# Patient Record
Sex: Female | Born: 1935 | Race: White | Hispanic: No | Marital: Married | Smoking: Never smoker
Health system: Southern US, Community
[De-identification: ages and names within clinical notes are randomized; demographics above are authoritative.]

## PROBLEM LIST (undated history)

## (undated) DIAGNOSIS — E7800 Pure hypercholesterolemia, unspecified: Secondary | ICD-10-CM

## (undated) DIAGNOSIS — K219 Gastro-esophageal reflux disease without esophagitis: Secondary | ICD-10-CM

## (undated) HISTORY — PX: BREAST SURGERY: SHX581

## (undated) HISTORY — PX: CHOLECYSTECTOMY: SHX55

## (undated) HISTORY — PX: ABDOMINAL HYSTERECTOMY: SHX81

---

## 2005-02-25 ENCOUNTER — Ambulatory Visit (HOSPITAL_COMMUNITY): Admission: RE | Admit: 2005-02-25 | Discharge: 2005-02-25 | Payer: Self-pay | Admitting: Orthopaedic Surgery

## 2006-09-28 ENCOUNTER — Encounter (HOSPITAL_COMMUNITY): Admission: RE | Admit: 2006-09-28 | Discharge: 2006-10-28 | Payer: Self-pay | Admitting: Neurology

## 2007-01-03 ENCOUNTER — Ambulatory Visit (HOSPITAL_COMMUNITY): Admission: RE | Admit: 2007-01-03 | Discharge: 2007-01-03 | Payer: Self-pay | Admitting: Ophthalmology

## 2011-02-23 DIAGNOSIS — R079 Chest pain, unspecified: Secondary | ICD-10-CM

## 2013-06-08 ENCOUNTER — Encounter (HOSPITAL_COMMUNITY): Payer: Medicare Other

## 2013-06-23 ENCOUNTER — Emergency Department (HOSPITAL_COMMUNITY): Payer: Medicare HMO

## 2013-06-23 ENCOUNTER — Emergency Department (HOSPITAL_COMMUNITY)
Admission: EM | Admit: 2013-06-23 | Discharge: 2013-06-23 | Disposition: A | Discharge status: Home / Self Care | Payer: Medicare HMO | Attending: Emergency Medicine | Admitting: Emergency Medicine

## 2013-06-23 ENCOUNTER — Encounter (HOSPITAL_COMMUNITY): Payer: Self-pay | Admitting: Emergency Medicine

## 2013-06-23 DIAGNOSIS — R0602 Shortness of breath: Secondary | ICD-10-CM | POA: Insufficient documentation

## 2013-06-23 DIAGNOSIS — E7800 Pure hypercholesterolemia, unspecified: Secondary | ICD-10-CM | POA: Insufficient documentation

## 2013-06-23 DIAGNOSIS — R079 Chest pain, unspecified: Secondary | ICD-10-CM

## 2013-06-23 DIAGNOSIS — R0789 Other chest pain: Secondary | ICD-10-CM | POA: Insufficient documentation

## 2013-06-23 DIAGNOSIS — R05 Cough: Secondary | ICD-10-CM | POA: Insufficient documentation

## 2013-06-23 DIAGNOSIS — K219 Gastro-esophageal reflux disease without esophagitis: Secondary | ICD-10-CM | POA: Insufficient documentation

## 2013-06-23 DIAGNOSIS — R059 Cough, unspecified: Secondary | ICD-10-CM | POA: Insufficient documentation

## 2013-06-23 HISTORY — DX: Gastro-esophageal reflux disease without esophagitis: K21.9

## 2013-06-23 HISTORY — DX: Pure hypercholesterolemia, unspecified: E78.00

## 2013-06-23 LAB — COMPREHENSIVE METABOLIC PANEL
ALK PHOS: 99 U/L (ref 39–117)
ALT: 16 U/L (ref 0–35)
AST: 18 U/L (ref 0–37)
Albumin: 3.3 g/dL — ABNORMAL LOW (ref 3.5–5.2)
BUN: 13 mg/dL (ref 6–23)
CALCIUM: 9.1 mg/dL (ref 8.4–10.5)
CHLORIDE: 105 meq/L (ref 96–112)
CO2: 28 meq/L (ref 19–32)
Creatinine, Ser: 0.87 mg/dL (ref 0.50–1.10)
GFR, EST AFRICAN AMERICAN: 71 mL/min — AB (ref >90)
GFR, EST NON AFRICAN AMERICAN: 61 mL/min — AB (ref >90)
GLUCOSE: 108 mg/dL — AB (ref 70–99)
POTASSIUM: 3.8 meq/L (ref 3.7–5.3)
SODIUM: 143 meq/L (ref 137–147)
Total Bilirubin: 0.5 mg/dL (ref 0.3–1.2)
Total Protein: 6.8 g/dL (ref 6.0–8.3)

## 2013-06-23 LAB — CBC WITH DIFFERENTIAL/PLATELET
BASOS ABS: 0 10*3/uL (ref 0.0–0.1)
Basophils Relative: 0 % (ref 0–1)
Eosinophils Absolute: 0.2 10*3/uL (ref 0.0–0.7)
Eosinophils Relative: 2 % (ref 0–5)
HCT: 37.2 % (ref 36.0–46.0)
Hemoglobin: 12.5 g/dL (ref 12.0–15.0)
LYMPHS ABS: 1.5 10*3/uL (ref 0.7–4.0)
LYMPHS PCT: 21 % (ref 12–46)
MCH: 30.3 pg (ref 26.0–34.0)
MCHC: 33.6 g/dL (ref 30.0–36.0)
MCV: 90.3 fL (ref 78.0–100.0)
Monocytes Absolute: 0.6 10*3/uL (ref 0.1–1.0)
Monocytes Relative: 8 % (ref 3–12)
NEUTROS ABS: 4.9 10*3/uL (ref 1.7–7.7)
NEUTROS PCT: 69 % (ref 43–77)
PLATELETS: 253 10*3/uL (ref 150–400)
RBC: 4.12 MIL/uL (ref 3.87–5.11)
RDW: 13 % (ref 11.5–15.5)
WBC: 7.1 10*3/uL (ref 4.0–10.5)

## 2013-06-23 LAB — TROPONIN I: Troponin I: <0.3 ng/mL (ref <0.30)

## 2013-06-23 LAB — PRO B NATRIURETIC PEPTIDE: Pro B Natriuretic peptide (BNP): 87.3 pg/mL (ref 0–450)

## 2013-06-23 LAB — D-DIMER, QUANTITATIVE (NOT AT ARMC): D DIMER QUANT: 0.37 ug{FEU}/mL (ref 0.00–0.48)

## 2013-06-23 NOTE — ED Notes (Signed)
C/o chest pain, shortness of breath for the past week

## 2013-06-23 NOTE — Care Management Note (Signed)
Patient was noted to not have a PCP listed, but per patient PCP is Dr Hasani. Entered this information into computer. 

## 2013-06-23 NOTE — ED Provider Notes (Signed)
CSN: [id number]     Arrival date & time 06/23/13  1028 History  This chart was scribed for Paula LennertJoseph L Lydie Stammen, MD by Bronson CurbJacqueline Melvin, ED Scribe. This patient was seen in room APA11/APA11 and the patient's care was started at 11:58 AM.    Chief Complaint  Patient presents with  . Chest Pain     Patient is a 80 y.o. female presenting with shortness of breath. The history is provided by the patient. No language interpreter was used.  Shortness of Breath Severity:  Moderate Onset quality:  Gradual Duration:  1 week Timing:  Constant Progression:  Worsening Chronicity:  New Associated symptoms: cough   Associated symptoms: no abdominal pain, no chest pain, no fever, no headaches and no rash    HPI Comments: Angus SellerLaura E Hanson is a 80 y.o. female who presents to the Emergency Department complaining of gradual onset, gradually worsening SOB that started 7 days ago. She states associated chest tightness, and nonproductive cough for a few weeks. She states the SOB occurs even at rest. She denies fever, leg swelling, chest pain.   Past Medical History  Diagnosis Date  . Acid reflux   . Hypercholesteremia    Past Surgical History  Procedure Laterality Date  . Abdominal hysterectomy    . Breast surgery    . Cholecystectomy     No family history on file. History  Substance Use Topics  . Smoking status: Never Smoker   . Smokeless tobacco: Not on file  . Alcohol Use: No   OB History   Grav Para Term Preterm Abortions TAB SAB Ect Mult Living                 Review of Systems  Constitutional: Negative for fever, appetite change and fatigue.  HENT: Negative for congestion, ear discharge and sinus pressure.   Eyes: Negative for discharge.  Respiratory: Positive for cough, chest tightness and shortness of breath.   Cardiovascular: Negative for chest pain and leg swelling.  Gastrointestinal: Negative for abdominal pain and diarrhea.  Genitourinary: Negative for frequency and hematuria.   Musculoskeletal: Negative for back pain.  Skin: Negative for rash.  Neurological: Negative for seizures and headaches.  Psychiatric/Behavioral: Negative for hallucinations.      Allergies  Review of patient's allergies indicates no known allergies.  Home Medications  No current outpatient prescriptions on file. BP 178/75  Pulse 70  Temp(Src) 97.9 F (36.6 C)  Resp 20  Ht 5\' 4"  (1.626 m)  Wt 170 lb (77.111 kg)  BMI 29.17 kg/m2  SpO2 100% Physical Exam  Nursing note and vitals reviewed. Constitutional: She is oriented to person, place, and time. She appears well-developed.  HENT:  Head: Normocephalic.  Eyes: Conjunctivae and EOM are normal. No scleral icterus.  Neck: Neck supple. No thyromegaly present.  Cardiovascular: Normal rate, regular rhythm and normal heart sounds.  Exam reveals no gallop and no friction rub.   No murmur heard. Pulmonary/Chest: Effort normal and breath sounds normal. No stridor. She has no wheezes. She has no rales. She exhibits no tenderness.  Abdominal: Soft. She exhibits no distension. There is no tenderness. There is no rebound.  Musculoskeletal: Normal range of motion. She exhibits no edema.  Lymphadenopathy:    She has no cervical adenopathy.  Neurological: She is oriented to person, place, and time. She exhibits normal muscle tone. Coordination normal.  Skin: No rash noted. No erythema.  Psychiatric: She has a normal mood and affect. Her behavior is normal.  ED Course  Procedures (including critical care time)   DIAGNOSTIC STUDIES: Oxygen Saturation is 100% on RA, normal by my interpretation.    COORDINATION OF CARE: 12:03 PM Discussed treatment plan with pt at bedside and pt agreed to plan.     Labs Review Labs Reviewed  COMPREHENSIVE METABOLIC PANEL - Abnormal; Notable for the following:    Glucose, Bld 108 (*)    Albumin 3.3 (*)    GFR calc non Af Amer 61 (*)    GFR calc Af Amer 71 (*)    All other components within normal  limits  TROPONIN I  CBC WITH DIFFERENTIAL  PRO B NATRIURETIC PEPTIDE   Imaging Review Dg Chest 2 View  06/23/2013   CLINICAL DATA:  Chest pain and discomfort.  Difficulty breathing.  EXAM: CHEST  2 VIEW  COMPARISON:  None.  FINDINGS: Artifact overlies the chest. Heart size is normal. Mediastinal shadows are normal. The lungs are clear. No effusions. No bony abnormalities.  IMPRESSION: No active cardiopulmonary disease.   Electronically Signed   By: Paulina Fusi M.D.   On: 06/23/2013 11:51     EKG Interpretation   Date/Time:  Friday June 23 2013 10:52:57 EDT Ventricular Rate:  69 PR Interval:  176 QRS Duration: 86 QT Interval:  406 QTC Calculation: 435 R Axis:   -2 Text Interpretation:  Normal sinus rhythm Normal ECG No previous ECGs  available Confirmed by Piers Baade  MD, Verlyn Dannenberg [phone]) on 06/23/2013 4:00:06 PM      MDM   Final diagnoses:  None   Chest pain not exertional.  Normal studies.  Pt to increase prilosec and follow up with pcp The chart was scribed for me under my direct supervision.  I personally performed the history, physical, and medical decision making and all procedures in the evaluation of this patient.. .     Paula Lennert, MD 06/23/13 [phone]

## 2013-06-23 NOTE — Discharge Instructions (Signed)
Increase prilosec to twice a day and follow up with your md next week °

## 2015-03-21 DIAGNOSIS — M174 Other bilateral secondary osteoarthritis of knee: Secondary | ICD-10-CM | POA: Diagnosis not present

## 2015-03-21 DIAGNOSIS — Z6828 Body mass index (BMI) 28.0-28.9, adult: Secondary | ICD-10-CM | POA: Diagnosis not present

## 2015-03-21 DIAGNOSIS — E784 Other hyperlipidemia: Secondary | ICD-10-CM | POA: Diagnosis not present

## 2015-03-21 DIAGNOSIS — F418 Other specified anxiety disorders: Secondary | ICD-10-CM | POA: Diagnosis not present

## 2015-03-21 DIAGNOSIS — I10 Essential (primary) hypertension: Secondary | ICD-10-CM | POA: Diagnosis not present

## 2015-06-20 DIAGNOSIS — Z1389 Encounter for screening for other disorder: Secondary | ICD-10-CM | POA: Diagnosis not present

## 2015-06-20 DIAGNOSIS — M174 Other bilateral secondary osteoarthritis of knee: Secondary | ICD-10-CM | POA: Diagnosis not present

## 2015-06-20 DIAGNOSIS — Z0000 Encounter for general adult medical examination without abnormal findings: Secondary | ICD-10-CM | POA: Diagnosis not present

## 2015-06-20 DIAGNOSIS — Z6828 Body mass index (BMI) 28.0-28.9, adult: Secondary | ICD-10-CM | POA: Diagnosis not present

## 2015-06-20 DIAGNOSIS — I10 Essential (primary) hypertension: Secondary | ICD-10-CM | POA: Diagnosis not present

## 2015-09-09 DIAGNOSIS — Z90710 Acquired absence of both cervix and uterus: Secondary | ICD-10-CM | POA: Diagnosis not present

## 2015-09-09 DIAGNOSIS — Z79899 Other long term (current) drug therapy: Secondary | ICD-10-CM | POA: Diagnosis not present

## 2015-09-09 DIAGNOSIS — F419 Anxiety disorder, unspecified: Secondary | ICD-10-CM | POA: Diagnosis not present

## 2015-09-09 DIAGNOSIS — I10 Essential (primary) hypertension: Secondary | ICD-10-CM | POA: Diagnosis not present

## 2015-09-09 DIAGNOSIS — M810 Age-related osteoporosis without current pathological fracture: Secondary | ICD-10-CM | POA: Diagnosis not present

## 2015-09-09 DIAGNOSIS — Z780 Asymptomatic menopausal state: Secondary | ICD-10-CM | POA: Diagnosis not present

## 2015-09-09 DIAGNOSIS — M8588 Other specified disorders of bone density and structure, other site: Secondary | ICD-10-CM | POA: Diagnosis not present

## 2015-09-20 DIAGNOSIS — I10 Essential (primary) hypertension: Secondary | ICD-10-CM | POA: Diagnosis not present

## 2015-09-20 DIAGNOSIS — M174 Other bilateral secondary osteoarthritis of knee: Secondary | ICD-10-CM | POA: Diagnosis not present

## 2015-09-20 DIAGNOSIS — M810 Age-related osteoporosis without current pathological fracture: Secondary | ICD-10-CM | POA: Diagnosis not present

## 2015-09-20 DIAGNOSIS — Z6826 Body mass index (BMI) 26.0-26.9, adult: Secondary | ICD-10-CM | POA: Diagnosis not present

## 2015-09-20 DIAGNOSIS — K210 Gastro-esophageal reflux disease with esophagitis: Secondary | ICD-10-CM | POA: Diagnosis not present

## 2015-10-07 DIAGNOSIS — M810 Age-related osteoporosis without current pathological fracture: Secondary | ICD-10-CM | POA: Diagnosis not present

## 2015-11-21 DIAGNOSIS — I10 Essential (primary) hypertension: Secondary | ICD-10-CM | POA: Diagnosis not present

## 2015-11-21 DIAGNOSIS — M158 Other polyosteoarthritis: Secondary | ICD-10-CM | POA: Diagnosis not present

## 2015-11-21 DIAGNOSIS — K210 Gastro-esophageal reflux disease with esophagitis: Secondary | ICD-10-CM | POA: Diagnosis not present

## 2015-12-18 DIAGNOSIS — I10 Essential (primary) hypertension: Secondary | ICD-10-CM | POA: Diagnosis not present

## 2015-12-18 DIAGNOSIS — M158 Other polyosteoarthritis: Secondary | ICD-10-CM | POA: Diagnosis not present

## 2015-12-18 DIAGNOSIS — K210 Gastro-esophageal reflux disease with esophagitis: Secondary | ICD-10-CM | POA: Diagnosis not present

## 2015-12-23 DIAGNOSIS — Z79899 Other long term (current) drug therapy: Secondary | ICD-10-CM | POA: Diagnosis not present

## 2015-12-23 DIAGNOSIS — Z6827 Body mass index (BMI) 27.0-27.9, adult: Secondary | ICD-10-CM | POA: Diagnosis not present

## 2015-12-23 DIAGNOSIS — K210 Gastro-esophageal reflux disease with esophagitis: Secondary | ICD-10-CM | POA: Diagnosis not present

## 2015-12-23 DIAGNOSIS — M810 Age-related osteoporosis without current pathological fracture: Secondary | ICD-10-CM | POA: Diagnosis not present

## 2015-12-23 DIAGNOSIS — I10 Essential (primary) hypertension: Secondary | ICD-10-CM | POA: Diagnosis not present

## 2016-01-08 ENCOUNTER — Ambulatory Visit (INDEPENDENT_AMBULATORY_CARE_PROVIDER_SITE_OTHER): Payer: PPO

## 2016-01-08 ENCOUNTER — Ambulatory Visit (INDEPENDENT_AMBULATORY_CARE_PROVIDER_SITE_OTHER): Payer: PPO | Admitting: Orthopaedic Surgery

## 2016-01-08 VITALS — BP 167/79 | HR 59 | Temp 97.5°F | Ht 64.0 in | Wt 168.4 lb

## 2016-01-08 DIAGNOSIS — M5442 Lumbago with sciatica, left side: Secondary | ICD-10-CM

## 2016-01-08 DIAGNOSIS — M25552 Pain in left hip: Secondary | ICD-10-CM | POA: Diagnosis not present

## 2016-01-08 DIAGNOSIS — M7062 Trochanteric bursitis, left hip: Secondary | ICD-10-CM

## 2016-01-08 MED ORDER — NAPROXEN 500 MG PO TABS: 500.0000 mg | ORAL_TABLET | Freq: Two times a day (BID) | ORAL | 5 refills | Status: DC

## 2016-01-08 NOTE — Progress Notes (Signed)
Subjective: My left hip hurts and I have pain that goes down my left leg    Patient ID: Paula Hanson, female    DOB: 1930/12/10, 82 y.o.   MRN: [id number]  HPI She has had pain that runs from the left hip to the left lateral foot for about a month to six weeks.  It is more painful at night. She has no trauma.  She has more pain of the left lateral hip area. She says it does not hurt during the day. She has no redness, no other joint pains.  She has tried Aleve which helped some.  She has used ice and heat as well.  She is active.  She has no weakness or bowel or bladder problems.   Review of Systems  HENT: Negative for congestion.   Respiratory: Negative for cough and shortness of breath.   Cardiovascular: Negative for chest pain and leg swelling.  Endocrine: Positive for cold intolerance.  Musculoskeletal: Positive for arthralgias, back pain and gait problem.  Allergic/Immunologic: Positive for environmental allergies.   Past Medical History:  Diagnosis Date  . Acid reflux   . Hypercholesteremia     Past Surgical History:  Procedure Laterality Date  . ABDOMINAL HYSTERECTOMY    . BREAST SURGERY    . CHOLECYSTECTOMY      Current Outpatient Prescriptions on File Prior to Visit  Medication Sig Dispense Refill  . Omega-3 Fatty Acids (FISH OIL PO) Take 3 capsules by mouth daily.    Marland Kitchen atorvastatin (LIPITOR) 40 MG tablet Take 40 mg by mouth daily.    Marland Kitchen omeprazole (PRILOSEC) 40 MG capsule Take 1 capsule by mouth daily.     No current facility-administered medications on file prior to visit.     Social History   Social History  . Marital status: Married    Spouse name: N/A  . Number of children: N/A  . Years of education: N/A   Occupational History  . Not on file.   Social History Main Topics  . Smoking status: Never Smoker  . Smokeless tobacco: Not on file  . Alcohol use No  . Drug use: Unknown  . Sexual activity: Not on file   Other Topics Concern  . Not on file    Social History Narrative  . No narrative on file    High blood pressure, heart disease runs in the family as well as osteoporosis.  Her mother died of stroke and her father of heart attack.  BP (!) 167/79   Pulse (!) 59   Temp 97.5 F (36.4 C)   Ht 5\' 4"  (1.626 m)   Wt 168 lb 6.4 oz (76.4 kg)   BMI 28.91 kg/m      Objective:   Physical Exam  Constitutional: She is oriented to person, place, and time. She appears well-developed and well-nourished.  HENT:  Head: Normocephalic and atraumatic.  Eyes: Conjunctivae and EOM are normal. Pupils are equal, round, and reactive to light.  Neck: Normal range of motion. Neck supple.  Cardiovascular: Normal rate, regular rhythm and intact distal pulses.   Pulmonary/Chest: Effort normal.  Abdominal: Soft.  Musculoskeletal: She exhibits tenderness (Pain of the left hip lateral trochanteric area, no redness, full ROM of hips.  Back with slight lower left pain, no spasm, full ROM.  NV intact.  SLR normal.).  Neurological: She is alert and oriented to person, place, and time. She displays normal reflexes. No cranial nerve deficit. She exhibits normal muscle tone. Coordination normal.  Skin: Skin is warm and dry.  Psychiatric: She has a normal mood and affect. Her behavior is normal. Judgment and thought content normal.   X-rays were done of the left hip and lumbar spine, reported separately.       Assessment & Plan:   Encounter Diagnoses  Name Primary?  . Left hip pain Yes  . Acute left-sided low back pain with left-sided sciatica   . Trochanteric bursitis, left hip    PROCEDURE NOTE:  The patient request injection, verbal consent was obtained.  The left trochanteric area of the hip was prepped appropriately after time out was performed.   Sterile technique was observed and injection of 1 cc of Depo-Medrol 40 mg with several cc's of plain xylocaine. Anesthesia was provided by ethyl chloride and a 20-gauge needle was used to inject  the hip area. The injection was tolerated well.  A band aid dressing was applied.  The patient was advised to apply ice later today and tomorrow to the injection sight as needed.  I will begin Naprosyn.  Precautions discussed.  Return in two weeks.  Call if any problem.  She may need MRI of the lumbar spine.  Electronically Signed Darreld McleanWayne Ardyce Heyer, MD 10/25/201710:18 AM

## 2016-01-15 DIAGNOSIS — I10 Essential (primary) hypertension: Secondary | ICD-10-CM | POA: Diagnosis not present

## 2016-01-15 DIAGNOSIS — M158 Other polyosteoarthritis: Secondary | ICD-10-CM | POA: Diagnosis not present

## 2016-01-15 DIAGNOSIS — K210 Gastro-esophageal reflux disease with esophagitis: Secondary | ICD-10-CM | POA: Diagnosis not present

## 2016-01-22 ENCOUNTER — Ambulatory Visit: Payer: PPO | Admitting: Orthopaedic Surgery

## 2016-02-26 DIAGNOSIS — M158 Other polyosteoarthritis: Secondary | ICD-10-CM | POA: Diagnosis not present

## 2016-02-26 DIAGNOSIS — K210 Gastro-esophageal reflux disease with esophagitis: Secondary | ICD-10-CM | POA: Diagnosis not present

## 2016-02-26 DIAGNOSIS — I10 Essential (primary) hypertension: Secondary | ICD-10-CM | POA: Diagnosis not present

## 2016-03-27 DIAGNOSIS — Z1231 Encounter for screening mammogram for malignant neoplasm of breast: Secondary | ICD-10-CM | POA: Diagnosis not present

## 2016-03-30 DIAGNOSIS — K210 Gastro-esophageal reflux disease with esophagitis: Secondary | ICD-10-CM | POA: Diagnosis not present

## 2016-03-30 DIAGNOSIS — Z6827 Body mass index (BMI) 27.0-27.9, adult: Secondary | ICD-10-CM | POA: Diagnosis not present

## 2016-03-30 DIAGNOSIS — I10 Essential (primary) hypertension: Secondary | ICD-10-CM | POA: Diagnosis not present

## 2016-03-30 DIAGNOSIS — M810 Age-related osteoporosis without current pathological fracture: Secondary | ICD-10-CM | POA: Diagnosis not present

## 2016-03-30 DIAGNOSIS — F411 Generalized anxiety disorder: Secondary | ICD-10-CM | POA: Diagnosis not present

## 2016-04-21 DIAGNOSIS — F411 Generalized anxiety disorder: Secondary | ICD-10-CM | POA: Diagnosis not present

## 2016-04-21 DIAGNOSIS — M810 Age-related osteoporosis without current pathological fracture: Secondary | ICD-10-CM | POA: Diagnosis not present

## 2016-04-21 DIAGNOSIS — K210 Gastro-esophageal reflux disease with esophagitis: Secondary | ICD-10-CM | POA: Diagnosis not present

## 2016-04-21 DIAGNOSIS — I10 Essential (primary) hypertension: Secondary | ICD-10-CM | POA: Diagnosis not present

## 2016-05-19 DIAGNOSIS — I10 Essential (primary) hypertension: Secondary | ICD-10-CM | POA: Diagnosis not present

## 2016-05-19 DIAGNOSIS — F411 Generalized anxiety disorder: Secondary | ICD-10-CM | POA: Diagnosis not present

## 2016-05-19 DIAGNOSIS — M810 Age-related osteoporosis without current pathological fracture: Secondary | ICD-10-CM | POA: Diagnosis not present

## 2016-05-19 DIAGNOSIS — K210 Gastro-esophageal reflux disease with esophagitis: Secondary | ICD-10-CM | POA: Diagnosis not present

## 2016-06-30 DIAGNOSIS — F411 Generalized anxiety disorder: Secondary | ICD-10-CM | POA: Diagnosis not present

## 2016-06-30 DIAGNOSIS — I10 Essential (primary) hypertension: Secondary | ICD-10-CM | POA: Diagnosis not present

## 2016-06-30 DIAGNOSIS — Z0000 Encounter for general adult medical examination without abnormal findings: Secondary | ICD-10-CM | POA: Diagnosis not present

## 2016-06-30 DIAGNOSIS — Z1389 Encounter for screening for other disorder: Secondary | ICD-10-CM | POA: Diagnosis not present

## 2016-06-30 DIAGNOSIS — Z6827 Body mass index (BMI) 27.0-27.9, adult: Secondary | ICD-10-CM | POA: Diagnosis not present

## 2016-06-30 DIAGNOSIS — M810 Age-related osteoporosis without current pathological fracture: Secondary | ICD-10-CM | POA: Diagnosis not present

## 2016-06-30 DIAGNOSIS — K210 Gastro-esophageal reflux disease with esophagitis: Secondary | ICD-10-CM | POA: Diagnosis not present

## 2016-07-09 DIAGNOSIS — I10 Essential (primary) hypertension: Secondary | ICD-10-CM | POA: Diagnosis not present

## 2016-07-09 DIAGNOSIS — M158 Other polyosteoarthritis: Secondary | ICD-10-CM | POA: Diagnosis not present

## 2016-07-09 DIAGNOSIS — K210 Gastro-esophageal reflux disease with esophagitis: Secondary | ICD-10-CM | POA: Diagnosis not present

## 2016-07-30 DIAGNOSIS — K210 Gastro-esophageal reflux disease with esophagitis: Secondary | ICD-10-CM | POA: Diagnosis not present

## 2016-07-30 DIAGNOSIS — I10 Essential (primary) hypertension: Secondary | ICD-10-CM | POA: Diagnosis not present

## 2016-07-30 DIAGNOSIS — M158 Other polyosteoarthritis: Secondary | ICD-10-CM | POA: Diagnosis not present

## 2016-08-07 DIAGNOSIS — R5383 Other fatigue: Secondary | ICD-10-CM | POA: Diagnosis not present

## 2016-08-07 DIAGNOSIS — Z6827 Body mass index (BMI) 27.0-27.9, adult: Secondary | ICD-10-CM | POA: Diagnosis not present

## 2016-08-11 DIAGNOSIS — R5383 Other fatigue: Secondary | ICD-10-CM | POA: Diagnosis not present

## 2016-08-18 DIAGNOSIS — I10 Essential (primary) hypertension: Secondary | ICD-10-CM | POA: Diagnosis not present

## 2016-08-18 DIAGNOSIS — M545 Low back pain: Secondary | ICD-10-CM | POA: Diagnosis not present

## 2016-08-18 DIAGNOSIS — K210 Gastro-esophageal reflux disease with esophagitis: Secondary | ICD-10-CM | POA: Diagnosis not present

## 2016-10-06 DIAGNOSIS — Z6827 Body mass index (BMI) 27.0-27.9, adult: Secondary | ICD-10-CM | POA: Diagnosis not present

## 2016-10-06 DIAGNOSIS — M810 Age-related osteoporosis without current pathological fracture: Secondary | ICD-10-CM | POA: Diagnosis not present

## 2016-10-06 DIAGNOSIS — I10 Essential (primary) hypertension: Secondary | ICD-10-CM | POA: Diagnosis not present

## 2016-10-06 DIAGNOSIS — K210 Gastro-esophageal reflux disease with esophagitis: Secondary | ICD-10-CM | POA: Diagnosis not present

## 2016-10-20 DIAGNOSIS — K2960 Other gastritis without bleeding: Secondary | ICD-10-CM | POA: Diagnosis not present

## 2016-10-20 DIAGNOSIS — Z87891 Personal history of nicotine dependence: Secondary | ICD-10-CM | POA: Diagnosis not present

## 2016-10-20 DIAGNOSIS — Z780 Asymptomatic menopausal state: Secondary | ICD-10-CM | POA: Diagnosis not present

## 2016-10-20 DIAGNOSIS — R0602 Shortness of breath: Secondary | ICD-10-CM | POA: Diagnosis not present

## 2016-10-20 DIAGNOSIS — R0789 Other chest pain: Secondary | ICD-10-CM | POA: Diagnosis not present

## 2016-10-20 DIAGNOSIS — R42 Dizziness and giddiness: Secondary | ICD-10-CM | POA: Diagnosis not present

## 2016-10-20 DIAGNOSIS — M810 Age-related osteoporosis without current pathological fracture: Secondary | ICD-10-CM | POA: Diagnosis not present

## 2016-10-20 DIAGNOSIS — B9681 Helicobacter pylori [H. pylori] as the cause of diseases classified elsewhere: Secondary | ICD-10-CM | POA: Diagnosis not present

## 2016-10-20 DIAGNOSIS — Z8249 Family history of ischemic heart disease and other diseases of the circulatory system: Secondary | ICD-10-CM | POA: Diagnosis not present

## 2016-10-20 DIAGNOSIS — I10 Essential (primary) hypertension: Secondary | ICD-10-CM | POA: Diagnosis not present

## 2016-10-20 DIAGNOSIS — K2970 Gastritis, unspecified, without bleeding: Secondary | ICD-10-CM | POA: Diagnosis not present

## 2016-10-20 DIAGNOSIS — M1990 Unspecified osteoarthritis, unspecified site: Secondary | ICD-10-CM | POA: Diagnosis not present

## 2016-10-20 DIAGNOSIS — I700 Atherosclerosis of aorta: Secondary | ICD-10-CM | POA: Diagnosis not present

## 2016-10-20 DIAGNOSIS — Z79899 Other long term (current) drug therapy: Secondary | ICD-10-CM | POA: Diagnosis not present

## 2016-10-20 DIAGNOSIS — K219 Gastro-esophageal reflux disease without esophagitis: Secondary | ICD-10-CM | POA: Diagnosis not present

## 2016-10-20 DIAGNOSIS — R079 Chest pain, unspecified: Secondary | ICD-10-CM | POA: Diagnosis not present

## 2016-10-20 DIAGNOSIS — F411 Generalized anxiety disorder: Secondary | ICD-10-CM | POA: Diagnosis not present

## 2016-10-20 DIAGNOSIS — R531 Weakness: Secondary | ICD-10-CM | POA: Diagnosis not present

## 2016-10-21 DIAGNOSIS — K219 Gastro-esophageal reflux disease without esophagitis: Secondary | ICD-10-CM | POA: Diagnosis not present

## 2016-10-21 DIAGNOSIS — R0789 Other chest pain: Secondary | ICD-10-CM | POA: Diagnosis not present

## 2016-10-21 DIAGNOSIS — I10 Essential (primary) hypertension: Secondary | ICD-10-CM | POA: Diagnosis not present

## 2016-10-21 DIAGNOSIS — K2960 Other gastritis without bleeding: Secondary | ICD-10-CM | POA: Diagnosis not present

## 2016-10-21 DIAGNOSIS — B9681 Helicobacter pylori [H. pylori] as the cause of diseases classified elsewhere: Secondary | ICD-10-CM | POA: Diagnosis not present

## 2016-10-27 DIAGNOSIS — I10 Essential (primary) hypertension: Secondary | ICD-10-CM | POA: Diagnosis not present

## 2016-10-27 DIAGNOSIS — K210 Gastro-esophageal reflux disease with esophagitis: Secondary | ICD-10-CM | POA: Diagnosis not present

## 2016-10-27 DIAGNOSIS — M158 Other polyosteoarthritis: Secondary | ICD-10-CM | POA: Diagnosis not present

## 2016-10-30 DIAGNOSIS — K2900 Acute gastritis without bleeding: Secondary | ICD-10-CM | POA: Diagnosis not present

## 2016-10-30 DIAGNOSIS — Z6826 Body mass index (BMI) 26.0-26.9, adult: Secondary | ICD-10-CM | POA: Diagnosis not present

## 2016-11-04 DIAGNOSIS — R197 Diarrhea, unspecified: Secondary | ICD-10-CM | POA: Diagnosis not present

## 2016-11-20 DIAGNOSIS — K210 Gastro-esophageal reflux disease with esophagitis: Secondary | ICD-10-CM | POA: Diagnosis not present

## 2016-11-20 DIAGNOSIS — M150 Primary generalized (osteo)arthritis: Secondary | ICD-10-CM | POA: Diagnosis not present

## 2016-11-20 DIAGNOSIS — I10 Essential (primary) hypertension: Secondary | ICD-10-CM | POA: Diagnosis not present

## 2017-01-13 DIAGNOSIS — I10 Essential (primary) hypertension: Secondary | ICD-10-CM | POA: Diagnosis not present

## 2017-01-13 DIAGNOSIS — F411 Generalized anxiety disorder: Secondary | ICD-10-CM | POA: Diagnosis not present

## 2017-01-13 DIAGNOSIS — M810 Age-related osteoporosis without current pathological fracture: Secondary | ICD-10-CM | POA: Diagnosis not present

## 2017-01-13 DIAGNOSIS — Z6827 Body mass index (BMI) 27.0-27.9, adult: Secondary | ICD-10-CM | POA: Diagnosis not present

## 2017-01-13 DIAGNOSIS — K210 Gastro-esophageal reflux disease with esophagitis: Secondary | ICD-10-CM | POA: Diagnosis not present

## 2017-02-23 DIAGNOSIS — M810 Age-related osteoporosis without current pathological fracture: Secondary | ICD-10-CM | POA: Diagnosis not present

## 2017-02-23 DIAGNOSIS — K210 Gastro-esophageal reflux disease with esophagitis: Secondary | ICD-10-CM | POA: Diagnosis not present

## 2017-02-23 DIAGNOSIS — I10 Essential (primary) hypertension: Secondary | ICD-10-CM | POA: Diagnosis not present

## 2017-03-31 DIAGNOSIS — M810 Age-related osteoporosis without current pathological fracture: Secondary | ICD-10-CM | POA: Diagnosis not present

## 2017-03-31 DIAGNOSIS — I10 Essential (primary) hypertension: Secondary | ICD-10-CM | POA: Diagnosis not present

## 2017-03-31 DIAGNOSIS — K210 Gastro-esophageal reflux disease with esophagitis: Secondary | ICD-10-CM | POA: Diagnosis not present

## 2017-04-08 DIAGNOSIS — Z1231 Encounter for screening mammogram for malignant neoplasm of breast: Secondary | ICD-10-CM | POA: Diagnosis not present

## 2017-04-26 DIAGNOSIS — Z6827 Body mass index (BMI) 27.0-27.9, adult: Secondary | ICD-10-CM | POA: Diagnosis not present

## 2017-04-26 DIAGNOSIS — I10 Essential (primary) hypertension: Secondary | ICD-10-CM | POA: Diagnosis not present

## 2017-04-26 DIAGNOSIS — F411 Generalized anxiety disorder: Secondary | ICD-10-CM | POA: Diagnosis not present

## 2017-04-26 DIAGNOSIS — K210 Gastro-esophageal reflux disease with esophagitis: Secondary | ICD-10-CM | POA: Diagnosis not present

## 2017-04-26 DIAGNOSIS — M810 Age-related osteoporosis without current pathological fracture: Secondary | ICD-10-CM | POA: Diagnosis not present

## 2017-04-26 DIAGNOSIS — F5105 Insomnia due to other mental disorder: Secondary | ICD-10-CM | POA: Diagnosis not present

## 2017-07-05 DIAGNOSIS — K210 Gastro-esophageal reflux disease with esophagitis: Secondary | ICD-10-CM | POA: Diagnosis not present

## 2017-07-05 DIAGNOSIS — M810 Age-related osteoporosis without current pathological fracture: Secondary | ICD-10-CM | POA: Diagnosis not present

## 2017-07-05 DIAGNOSIS — F411 Generalized anxiety disorder: Secondary | ICD-10-CM | POA: Diagnosis not present

## 2017-07-05 DIAGNOSIS — I10 Essential (primary) hypertension: Secondary | ICD-10-CM | POA: Diagnosis not present

## 2017-07-26 DIAGNOSIS — I10 Essential (primary) hypertension: Secondary | ICD-10-CM | POA: Diagnosis not present

## 2017-07-26 DIAGNOSIS — F411 Generalized anxiety disorder: Secondary | ICD-10-CM | POA: Diagnosis not present

## 2017-07-26 DIAGNOSIS — K210 Gastro-esophageal reflux disease with esophagitis: Secondary | ICD-10-CM | POA: Diagnosis not present

## 2017-07-26 DIAGNOSIS — M810 Age-related osteoporosis without current pathological fracture: Secondary | ICD-10-CM | POA: Diagnosis not present

## 2017-07-29 DIAGNOSIS — Z6828 Body mass index (BMI) 28.0-28.9, adult: Secondary | ICD-10-CM | POA: Diagnosis not present

## 2017-07-29 DIAGNOSIS — K210 Gastro-esophageal reflux disease with esophagitis: Secondary | ICD-10-CM | POA: Diagnosis not present

## 2017-07-29 DIAGNOSIS — F411 Generalized anxiety disorder: Secondary | ICD-10-CM | POA: Diagnosis not present

## 2017-07-29 DIAGNOSIS — F5105 Insomnia due to other mental disorder: Secondary | ICD-10-CM | POA: Diagnosis not present

## 2017-07-29 DIAGNOSIS — M810 Age-related osteoporosis without current pathological fracture: Secondary | ICD-10-CM | POA: Diagnosis not present

## 2017-07-29 DIAGNOSIS — I10 Essential (primary) hypertension: Secondary | ICD-10-CM | POA: Diagnosis not present

## 2017-10-01 DIAGNOSIS — M810 Age-related osteoporosis without current pathological fracture: Secondary | ICD-10-CM | POA: Diagnosis not present

## 2017-10-01 DIAGNOSIS — I10 Essential (primary) hypertension: Secondary | ICD-10-CM | POA: Diagnosis not present

## 2017-10-01 DIAGNOSIS — F411 Generalized anxiety disorder: Secondary | ICD-10-CM | POA: Diagnosis not present

## 2017-10-01 DIAGNOSIS — K210 Gastro-esophageal reflux disease with esophagitis: Secondary | ICD-10-CM | POA: Diagnosis not present

## 2017-11-09 DIAGNOSIS — K210 Gastro-esophageal reflux disease with esophagitis: Secondary | ICD-10-CM | POA: Diagnosis not present

## 2017-11-09 DIAGNOSIS — Z0000 Encounter for general adult medical examination without abnormal findings: Secondary | ICD-10-CM | POA: Diagnosis not present

## 2017-11-09 DIAGNOSIS — F411 Generalized anxiety disorder: Secondary | ICD-10-CM | POA: Diagnosis not present

## 2017-11-09 DIAGNOSIS — M810 Age-related osteoporosis without current pathological fracture: Secondary | ICD-10-CM | POA: Diagnosis not present

## 2017-11-09 DIAGNOSIS — F5105 Insomnia due to other mental disorder: Secondary | ICD-10-CM | POA: Diagnosis not present

## 2017-11-09 DIAGNOSIS — Z6828 Body mass index (BMI) 28.0-28.9, adult: Secondary | ICD-10-CM | POA: Diagnosis not present

## 2017-11-09 DIAGNOSIS — I10 Essential (primary) hypertension: Secondary | ICD-10-CM | POA: Diagnosis not present

## 2017-11-09 DIAGNOSIS — Z1389 Encounter for screening for other disorder: Secondary | ICD-10-CM | POA: Diagnosis not present

## 2017-11-11 DIAGNOSIS — F411 Generalized anxiety disorder: Secondary | ICD-10-CM | POA: Diagnosis not present

## 2017-11-11 DIAGNOSIS — K210 Gastro-esophageal reflux disease with esophagitis: Secondary | ICD-10-CM | POA: Diagnosis not present

## 2017-11-11 DIAGNOSIS — I10 Essential (primary) hypertension: Secondary | ICD-10-CM | POA: Diagnosis not present

## 2017-11-11 DIAGNOSIS — M810 Age-related osteoporosis without current pathological fracture: Secondary | ICD-10-CM | POA: Diagnosis not present

## 2017-12-10 DIAGNOSIS — F411 Generalized anxiety disorder: Secondary | ICD-10-CM | POA: Diagnosis not present

## 2017-12-10 DIAGNOSIS — K210 Gastro-esophageal reflux disease with esophagitis: Secondary | ICD-10-CM | POA: Diagnosis not present

## 2017-12-10 DIAGNOSIS — M810 Age-related osteoporosis without current pathological fracture: Secondary | ICD-10-CM | POA: Diagnosis not present

## 2017-12-10 DIAGNOSIS — I10 Essential (primary) hypertension: Secondary | ICD-10-CM | POA: Diagnosis not present

## 2017-12-14 DIAGNOSIS — M810 Age-related osteoporosis without current pathological fracture: Secondary | ICD-10-CM | POA: Diagnosis not present

## 2018-01-19 DIAGNOSIS — K210 Gastro-esophageal reflux disease with esophagitis: Secondary | ICD-10-CM | POA: Diagnosis not present

## 2018-01-19 DIAGNOSIS — F411 Generalized anxiety disorder: Secondary | ICD-10-CM | POA: Diagnosis not present

## 2018-01-19 DIAGNOSIS — I10 Essential (primary) hypertension: Secondary | ICD-10-CM | POA: Diagnosis not present

## 2018-01-19 DIAGNOSIS — M810 Age-related osteoporosis without current pathological fracture: Secondary | ICD-10-CM | POA: Diagnosis not present

## 2018-02-21 DIAGNOSIS — Z0000 Encounter for general adult medical examination without abnormal findings: Secondary | ICD-10-CM | POA: Diagnosis not present

## 2018-02-21 DIAGNOSIS — Z6828 Body mass index (BMI) 28.0-28.9, adult: Secondary | ICD-10-CM | POA: Diagnosis not present

## 2018-02-22 DIAGNOSIS — M158 Other polyosteoarthritis: Secondary | ICD-10-CM | POA: Diagnosis not present

## 2018-02-22 DIAGNOSIS — I10 Essential (primary) hypertension: Secondary | ICD-10-CM | POA: Diagnosis not present

## 2018-02-22 DIAGNOSIS — K210 Gastro-esophageal reflux disease with esophagitis: Secondary | ICD-10-CM | POA: Diagnosis not present

## 2018-04-06 DIAGNOSIS — I10 Essential (primary) hypertension: Secondary | ICD-10-CM | POA: Diagnosis not present

## 2018-04-06 DIAGNOSIS — K210 Gastro-esophageal reflux disease with esophagitis: Secondary | ICD-10-CM | POA: Diagnosis not present

## 2018-04-06 DIAGNOSIS — M158 Other polyosteoarthritis: Secondary | ICD-10-CM | POA: Diagnosis not present

## 2018-04-11 DIAGNOSIS — L821 Other seborrheic keratosis: Secondary | ICD-10-CM | POA: Diagnosis not present

## 2018-04-11 DIAGNOSIS — L570 Actinic keratosis: Secondary | ICD-10-CM | POA: Diagnosis not present

## 2018-04-11 DIAGNOSIS — X32XXXA Exposure to sunlight, initial encounter: Secondary | ICD-10-CM | POA: Diagnosis not present

## 2018-05-04 DIAGNOSIS — I10 Essential (primary) hypertension: Secondary | ICD-10-CM | POA: Diagnosis not present

## 2018-05-04 DIAGNOSIS — M158 Other polyosteoarthritis: Secondary | ICD-10-CM | POA: Diagnosis not present

## 2018-05-04 DIAGNOSIS — K210 Gastro-esophageal reflux disease with esophagitis: Secondary | ICD-10-CM | POA: Diagnosis not present

## 2018-05-26 DIAGNOSIS — K210 Gastro-esophageal reflux disease with esophagitis: Secondary | ICD-10-CM | POA: Diagnosis not present

## 2018-05-26 DIAGNOSIS — F411 Generalized anxiety disorder: Secondary | ICD-10-CM | POA: Diagnosis not present

## 2018-05-26 DIAGNOSIS — Z6828 Body mass index (BMI) 28.0-28.9, adult: Secondary | ICD-10-CM | POA: Diagnosis not present

## 2018-05-26 DIAGNOSIS — I10 Essential (primary) hypertension: Secondary | ICD-10-CM | POA: Diagnosis not present

## 2018-05-26 DIAGNOSIS — G4700 Insomnia, unspecified: Secondary | ICD-10-CM | POA: Diagnosis not present

## 2018-05-31 DIAGNOSIS — K210 Gastro-esophageal reflux disease with esophagitis: Secondary | ICD-10-CM | POA: Diagnosis not present

## 2018-05-31 DIAGNOSIS — I10 Essential (primary) hypertension: Secondary | ICD-10-CM | POA: Diagnosis not present

## 2018-06-28 DIAGNOSIS — K210 Gastro-esophageal reflux disease with esophagitis: Secondary | ICD-10-CM | POA: Diagnosis not present

## 2018-06-28 DIAGNOSIS — I10 Essential (primary) hypertension: Secondary | ICD-10-CM | POA: Diagnosis not present

## 2018-07-12 DIAGNOSIS — L570 Actinic keratosis: Secondary | ICD-10-CM | POA: Diagnosis not present

## 2018-07-28 DIAGNOSIS — K210 Gastro-esophageal reflux disease with esophagitis: Secondary | ICD-10-CM | POA: Diagnosis not present

## 2018-07-28 DIAGNOSIS — M158 Other polyosteoarthritis: Secondary | ICD-10-CM | POA: Diagnosis not present

## 2018-07-28 DIAGNOSIS — I10 Essential (primary) hypertension: Secondary | ICD-10-CM | POA: Diagnosis not present

## 2018-08-29 DIAGNOSIS — K210 Gastro-esophageal reflux disease with esophagitis: Secondary | ICD-10-CM | POA: Diagnosis not present

## 2018-08-29 DIAGNOSIS — M158 Other polyosteoarthritis: Secondary | ICD-10-CM | POA: Diagnosis not present

## 2018-08-29 DIAGNOSIS — I10 Essential (primary) hypertension: Secondary | ICD-10-CM | POA: Diagnosis not present

## 2018-09-01 DIAGNOSIS — Z0000 Encounter for general adult medical examination without abnormal findings: Secondary | ICD-10-CM | POA: Diagnosis not present

## 2018-09-01 DIAGNOSIS — M818 Other osteoporosis without current pathological fracture: Secondary | ICD-10-CM | POA: Diagnosis not present

## 2018-09-01 DIAGNOSIS — K210 Gastro-esophageal reflux disease with esophagitis: Secondary | ICD-10-CM | POA: Diagnosis not present

## 2018-09-01 DIAGNOSIS — I10 Essential (primary) hypertension: Secondary | ICD-10-CM | POA: Diagnosis not present

## 2018-09-01 DIAGNOSIS — Z1389 Encounter for screening for other disorder: Secondary | ICD-10-CM | POA: Diagnosis not present

## 2018-09-26 DIAGNOSIS — I10 Essential (primary) hypertension: Secondary | ICD-10-CM | POA: Diagnosis not present

## 2018-09-26 DIAGNOSIS — K210 Gastro-esophageal reflux disease with esophagitis: Secondary | ICD-10-CM | POA: Diagnosis not present

## 2018-09-26 DIAGNOSIS — M818 Other osteoporosis without current pathological fracture: Secondary | ICD-10-CM | POA: Diagnosis not present

## 2018-11-03 DIAGNOSIS — M818 Other osteoporosis without current pathological fracture: Secondary | ICD-10-CM | POA: Diagnosis not present

## 2018-11-03 DIAGNOSIS — K210 Gastro-esophageal reflux disease with esophagitis: Secondary | ICD-10-CM | POA: Diagnosis not present

## 2018-11-03 DIAGNOSIS — I10 Essential (primary) hypertension: Secondary | ICD-10-CM | POA: Diagnosis not present

## 2018-12-12 DIAGNOSIS — K210 Gastro-esophageal reflux disease with esophagitis: Secondary | ICD-10-CM | POA: Diagnosis not present

## 2018-12-12 DIAGNOSIS — M545 Low back pain: Secondary | ICD-10-CM | POA: Diagnosis not present

## 2018-12-12 DIAGNOSIS — M818 Other osteoporosis without current pathological fracture: Secondary | ICD-10-CM | POA: Diagnosis not present

## 2018-12-12 DIAGNOSIS — Z0000 Encounter for general adult medical examination without abnormal findings: Secondary | ICD-10-CM | POA: Diagnosis not present

## 2018-12-12 DIAGNOSIS — I10 Essential (primary) hypertension: Secondary | ICD-10-CM | POA: Diagnosis not present

## 2019-01-18 DIAGNOSIS — M545 Low back pain: Secondary | ICD-10-CM | POA: Diagnosis not present

## 2019-01-18 DIAGNOSIS — I10 Essential (primary) hypertension: Secondary | ICD-10-CM | POA: Diagnosis not present

## 2019-01-18 DIAGNOSIS — M818 Other osteoporosis without current pathological fracture: Secondary | ICD-10-CM | POA: Diagnosis not present

## 2019-02-22 DIAGNOSIS — M545 Low back pain: Secondary | ICD-10-CM | POA: Diagnosis not present

## 2019-02-22 DIAGNOSIS — M818 Other osteoporosis without current pathological fracture: Secondary | ICD-10-CM | POA: Diagnosis not present

## 2019-02-22 DIAGNOSIS — I10 Essential (primary) hypertension: Secondary | ICD-10-CM | POA: Diagnosis not present

## 2019-03-13 DIAGNOSIS — M545 Low back pain: Secondary | ICD-10-CM | POA: Diagnosis not present

## 2019-03-13 DIAGNOSIS — I10 Essential (primary) hypertension: Secondary | ICD-10-CM | POA: Diagnosis not present

## 2019-03-13 DIAGNOSIS — M818 Other osteoporosis without current pathological fracture: Secondary | ICD-10-CM | POA: Diagnosis not present

## 2019-03-13 DIAGNOSIS — K2100 Gastro-esophageal reflux disease with esophagitis, without bleeding: Secondary | ICD-10-CM | POA: Diagnosis not present

## 2019-03-16 ENCOUNTER — Emergency Department (HOSPITAL_COMMUNITY): Payer: Medicare Other

## 2019-03-16 ENCOUNTER — Encounter (HOSPITAL_COMMUNITY): Payer: Self-pay

## 2019-03-16 ENCOUNTER — Other Ambulatory Visit: Payer: Self-pay

## 2019-03-16 ENCOUNTER — Inpatient Hospital Stay (HOSPITAL_COMMUNITY)
Admission: EM | Admit: 2019-03-16 | Discharge: 2019-03-18 | DRG: 083 | Disposition: A | Discharge status: Home / Self Care | Payer: Medicare Other | Attending: Internal Medicine | Admitting: Internal Medicine

## 2019-03-16 DIAGNOSIS — I6529 Occlusion and stenosis of unspecified carotid artery: Secondary | ICD-10-CM | POA: Diagnosis not present

## 2019-03-16 DIAGNOSIS — G459 Transient cerebral ischemic attack, unspecified: Secondary | ICD-10-CM

## 2019-03-16 DIAGNOSIS — E7800 Pure hypercholesterolemia, unspecified: Secondary | ICD-10-CM | POA: Diagnosis not present

## 2019-03-16 DIAGNOSIS — I739 Peripheral vascular disease, unspecified: Secondary | ICD-10-CM | POA: Diagnosis not present

## 2019-03-16 DIAGNOSIS — I6200 Nontraumatic subdural hemorrhage, unspecified: Secondary | ICD-10-CM | POA: Diagnosis not present

## 2019-03-16 DIAGNOSIS — K219 Gastro-esophageal reflux disease without esophagitis: Secondary | ICD-10-CM | POA: Diagnosis present

## 2019-03-16 DIAGNOSIS — Z6828 Body mass index (BMI) 28.0-28.9, adult: Secondary | ICD-10-CM

## 2019-03-16 DIAGNOSIS — I63233 Cerebral infarction due to unspecified occlusion or stenosis of bilateral carotid arteries: Secondary | ICD-10-CM | POA: Diagnosis not present

## 2019-03-16 DIAGNOSIS — N179 Acute kidney failure, unspecified: Secondary | ICD-10-CM | POA: Diagnosis present

## 2019-03-16 DIAGNOSIS — I088 Other rheumatic multiple valve diseases: Secondary | ICD-10-CM | POA: Diagnosis present

## 2019-03-16 DIAGNOSIS — H9190 Unspecified hearing loss, unspecified ear: Secondary | ICD-10-CM | POA: Diagnosis present

## 2019-03-16 DIAGNOSIS — S065XAA Traumatic subdural hemorrhage with loss of consciousness status unknown, initial encounter: Secondary | ICD-10-CM

## 2019-03-16 DIAGNOSIS — R4701 Aphasia: Secondary | ICD-10-CM | POA: Diagnosis not present

## 2019-03-16 DIAGNOSIS — Z743 Need for continuous supervision: Secondary | ICD-10-CM | POA: Diagnosis not present

## 2019-03-16 DIAGNOSIS — W1830XA Fall on same level, unspecified, initial encounter: Secondary | ICD-10-CM | POA: Diagnosis present

## 2019-03-16 DIAGNOSIS — Z9049 Acquired absence of other specified parts of digestive tract: Secondary | ICD-10-CM

## 2019-03-16 DIAGNOSIS — Z791 Long term (current) use of non-steroidal anti-inflammatories (NSAID): Secondary | ICD-10-CM | POA: Diagnosis not present

## 2019-03-16 DIAGNOSIS — I10 Essential (primary) hypertension: Secondary | ICD-10-CM | POA: Diagnosis present

## 2019-03-16 DIAGNOSIS — S065X9A Traumatic subdural hemorrhage with loss of consciousness of unspecified duration, initial encounter: Principal | ICD-10-CM | POA: Diagnosis present

## 2019-03-16 DIAGNOSIS — I951 Orthostatic hypotension: Secondary | ICD-10-CM | POA: Diagnosis present

## 2019-03-16 DIAGNOSIS — R29818 Other symptoms and signs involving the nervous system: Secondary | ICD-10-CM | POA: Diagnosis not present

## 2019-03-16 DIAGNOSIS — E860 Dehydration: Secondary | ICD-10-CM | POA: Diagnosis present

## 2019-03-16 DIAGNOSIS — I959 Hypotension, unspecified: Secondary | ICD-10-CM | POA: Diagnosis not present

## 2019-03-16 DIAGNOSIS — R682 Dry mouth, unspecified: Secondary | ICD-10-CM | POA: Diagnosis present

## 2019-03-16 DIAGNOSIS — R4781 Slurred speech: Secondary | ICD-10-CM | POA: Diagnosis not present

## 2019-03-16 DIAGNOSIS — S0011XA Contusion of right eyelid and periocular area, initial encounter: Secondary | ICD-10-CM | POA: Diagnosis present

## 2019-03-16 DIAGNOSIS — F419 Anxiety disorder, unspecified: Secondary | ICD-10-CM | POA: Diagnosis present

## 2019-03-16 DIAGNOSIS — E785 Hyperlipidemia, unspecified: Secondary | ICD-10-CM | POA: Diagnosis present

## 2019-03-16 DIAGNOSIS — I672 Cerebral atherosclerosis: Secondary | ICD-10-CM | POA: Diagnosis not present

## 2019-03-16 DIAGNOSIS — I6523 Occlusion and stenosis of bilateral carotid arteries: Secondary | ICD-10-CM | POA: Diagnosis present

## 2019-03-16 DIAGNOSIS — Z90710 Acquired absence of both cervix and uterus: Secondary | ICD-10-CM

## 2019-03-16 DIAGNOSIS — R29702 NIHSS score 2: Secondary | ICD-10-CM | POA: Diagnosis present

## 2019-03-16 DIAGNOSIS — Z79899 Other long term (current) drug therapy: Secondary | ICD-10-CM | POA: Diagnosis not present

## 2019-03-16 DIAGNOSIS — R4702 Dysphasia: Secondary | ICD-10-CM | POA: Diagnosis present

## 2019-03-16 DIAGNOSIS — R296 Repeated falls: Secondary | ICD-10-CM | POA: Diagnosis present

## 2019-03-16 DIAGNOSIS — Z20822 Contact with and (suspected) exposure to covid-19: Secondary | ICD-10-CM | POA: Diagnosis present

## 2019-03-16 DIAGNOSIS — Z853 Personal history of malignant neoplasm of breast: Secondary | ICD-10-CM

## 2019-03-16 DIAGNOSIS — R471 Dysarthria and anarthria: Secondary | ICD-10-CM | POA: Diagnosis present

## 2019-03-16 DIAGNOSIS — E663 Overweight: Secondary | ICD-10-CM | POA: Diagnosis present

## 2019-03-16 DIAGNOSIS — R42 Dizziness and giddiness: Secondary | ICD-10-CM | POA: Diagnosis not present

## 2019-03-16 DIAGNOSIS — R0689 Other abnormalities of breathing: Secondary | ICD-10-CM | POA: Diagnosis not present

## 2019-03-16 DIAGNOSIS — Z8673 Personal history of transient ischemic attack (TIA), and cerebral infarction without residual deficits: Secondary | ICD-10-CM

## 2019-03-16 LAB — DIFFERENTIAL
Abs Immature Granulocytes: 0.09 10*3/uL — ABNORMAL HIGH (ref 0.00–0.07)
Basophils Absolute: 0.1 10*3/uL (ref 0.0–0.1)
Basophils Relative: 1 %
Eosinophils Absolute: 0 10*3/uL (ref 0.0–0.5)
Eosinophils Relative: 0 %
Immature Granulocytes: 1 %
Lymphocytes Relative: 10 %
Lymphs Abs: 1.1 10*3/uL (ref 0.7–4.0)
Monocytes Absolute: 1 10*3/uL (ref 0.1–1.0)
Monocytes Relative: 8 %
Neutro Abs: 9.4 10*3/uL — ABNORMAL HIGH (ref 1.7–7.7)
Neutrophils Relative %: 80 %

## 2019-03-16 LAB — CBC
HCT: 37.1 % (ref 36.0–46.0)
Hemoglobin: 12.2 g/dL (ref 12.0–15.0)
MCH: 29.3 pg (ref 26.0–34.0)
MCHC: 32.9 g/dL (ref 30.0–36.0)
MCV: 89 fL (ref 80.0–100.0)
Platelets: 276 10*3/uL (ref 150–400)
RBC: 4.17 MIL/uL (ref 3.87–5.11)
RDW: 13.2 % (ref 11.5–15.5)
WBC: 11.6 10*3/uL — ABNORMAL HIGH (ref 4.0–10.5)
nRBC: 0 % (ref 0.0–0.2)

## 2019-03-16 LAB — I-STAT CHEM 8, ED
BUN: 19 mg/dL (ref 8–23)
Calcium, Ion: 1.15 mmol/L (ref 1.15–1.40)
Chloride: 99 mmol/L (ref 98–111)
Creatinine, Ser: 1.2 mg/dL — ABNORMAL HIGH (ref 0.44–1.00)
Glucose, Bld: 124 mg/dL — ABNORMAL HIGH (ref 70–99)
HCT: 38 % (ref 36.0–46.0)
Hemoglobin: 12.9 g/dL (ref 12.0–15.0)
Potassium: 4.3 mmol/L (ref 3.5–5.1)
Sodium: 134 mmol/L — ABNORMAL LOW (ref 135–145)
TCO2: 22 mmol/L (ref 22–32)

## 2019-03-16 LAB — COMPREHENSIVE METABOLIC PANEL
ALT: 14 U/L (ref 0–44)
AST: 15 U/L (ref 15–41)
Albumin: 3.6 g/dL (ref 3.5–5.0)
Alkaline Phosphatase: 55 U/L (ref 38–126)
Anion gap: 14 (ref 5–15)
BUN: 19 mg/dL (ref 8–23)
CO2: 20 mmol/L — ABNORMAL LOW (ref 22–32)
Calcium: 9.1 mg/dL (ref 8.9–10.3)
Chloride: 99 mmol/L (ref 98–111)
Creatinine, Ser: 1.23 mg/dL — ABNORMAL HIGH (ref 0.44–1.00)
GFR calc Af Amer: 46 mL/min — ABNORMAL LOW (ref >60)
GFR calc non Af Amer: 40 mL/min — ABNORMAL LOW (ref >60)
Glucose, Bld: 127 mg/dL — ABNORMAL HIGH (ref 70–99)
Potassium: 4.3 mmol/L (ref 3.5–5.1)
Sodium: 133 mmol/L — ABNORMAL LOW (ref 135–145)
Total Bilirubin: 1.3 mg/dL — ABNORMAL HIGH (ref 0.3–1.2)
Total Protein: 7.7 g/dL (ref 6.5–8.1)

## 2019-03-16 LAB — POC SARS CORONAVIRUS 2 AG -  ED: SARS Coronavirus 2 Ag: NEGATIVE

## 2019-03-16 LAB — PROTIME-INR
INR: 1 (ref 0.8–1.2)
Prothrombin Time: 13.2 seconds (ref 11.4–15.2)

## 2019-03-16 LAB — APTT: aPTT: 26 seconds (ref 24–36)

## 2019-03-16 LAB — CBG MONITORING, ED: Glucose-Capillary: 126 mg/dL — ABNORMAL HIGH (ref 70–99)

## 2019-03-16 MED ORDER — IOHEXOL 350 MG/ML SOLN
100.0000 mL | Freq: Once | INTRAVENOUS | Status: AC | PRN
  Administered 2019-03-16: 22:00:00 100 mL via INTRAVENOUS

## 2019-03-16 MED ORDER — SODIUM CHLORIDE 0.9% FLUSH
3.0000 mL | Freq: Once | INTRAVENOUS | Status: AC
  Administered 2019-03-16: 3 mL via INTRAVENOUS

## 2019-03-16 NOTE — ED Provider Notes (Signed)
Patient is an 85 year old woman who presents today in transfer from [facility] for expressive aphasia.  History primarily obtained from chart review.    Patient reportedly went to a doctor's visit today as she was not feeling well and there was reportedly concern by the doctor that she may have had a stroke.  Her son notes that she had a significant change in her speech at five ten when she had another fall.      According to chart review Dr. Hyacinth Meeker at [facility] initially evaluated the patient at 1845.      Imaging obtained at any pain showed a left-sided subdural hematoma 3 mm in size with no midline shift.    Dr. Otelia Limes, neuro hospitalist, is aware of patient according to previous notes.    Physical Exam  BP 133/69 (BP Location: Right Arm)   Pulse 70   Temp 97.6 F (36.4 C) (Temporal)   Resp (!) 25   SpO2 95%   Physical Exam Vitals and nursing note reviewed.  Constitutional:      General: She is not in acute distress.    Appearance: She is well-developed. She is not diaphoretic.  HENT:     Head: Normocephalic.  Eyes:     General: No scleral icterus.       Right eye: No discharge.        Left eye: No discharge.     Conjunctiva/sclera: Conjunctivae normal.  Cardiovascular:     Rate and Rhythm: Normal rate.  Pulmonary:     Effort: Pulmonary effort is normal. No respiratory distress.     Breath sounds: No stridor.  Musculoskeletal:        General: No deformity.  Skin:    General: Skin is warm and dry.  Neurological:     Mental Status: She is alert.     Motor: No abnormal muscle tone.     Comments: She is oriented to person, place, time.   She is able to lift bilateral legs off the leg and hold it for 5 seconds.  Speech is not consistently slurred.     Patient was evaluated at the same time as neurology was at bed side evaluating.    ED Course/Procedures   Clinical Course as of Mar 15 2337  Thu Mar 16, 2019  1903 D/w radiology  L subdurah hematoma - 27mm in  sizm No shift    [BM]  2023 Gust the case with Dr. Johnsie Cancel of the neurosurgical service who agrees that at this time he would not do any surgery however she would need to come to [facility], be cared for by the neurology team as well, if the bleeding got bigger or she became more unstable he would consider draining the subdural but at this time he states he would not operate.  Will discuss with neuro hospitalist   [BM]  2319 I spoke with Dr. Otelia Limes who recommends floor admission.  I spoke with Dr. Precious Reel who will see patient for admission.    [EH]    Clinical Course User Index [BM] Eber Hong, MD [EH] Cristina Gong, PA-C    Procedures  CT Code Stroke CTA Head W/WO contrast  Result Date: 03/16/2019 CLINICAL DATA:  Initial evaluation for acute stroke, aphasia. EXAM: CT ANGIOGRAPHY HEAD AND NECK TECHNIQUE: Multidetector CT imaging of the head and neck was performed using the standard protocol during bolus administration of intravenous contrast. Multiplanar CT image reconstructions and MIPs were obtained to evaluate the vascular anatomy.  Carotid stenosis measurements (when applicable) are obtained utilizing NASCET criteria, using the distal internal carotid diameter as the denominator. CONTRAST:  100mL OMNIPAQUE IOHEXOL 350 MG/ML SOLN COMPARISON:  Prior CT from earlier same day. FINDINGS: CTA NECK FINDINGS Aortic arch: Visualized arch ectatic measuring up to 3.8 cm in diameter. Mild to moderate plaque within the arch itself. No hemodynamically significant stenosis seen about the origin of the great vessels. Visualized subclavian arteries patent. Right carotid system: Right CCA patent from its origin to the bifurcation without stenosis. Scattered calcified plaque about the right bifurcation without hemodynamically significant stenosis. Right ICA tortuous proximally but widely patent to the skull base without stenosis, dissection or occlusion. Left carotid system: Left CCA widely patent  from its origin to the bifurcation without stenosis. Scattered mixed plaque about the left bifurcation/proximal left ICA without hemodynamically significant stenosis. Left ICA mildly tortuous but widely patent distally to the skull base without stenosis, dissection, or occlusion. Vertebral arteries: Both vertebral arteries arise from the subclavian arteries. Atheromatous plaque at the origin of the left vertebral artery with secondary mild approximate 30% stenosis. Vertebral arteries otherwise widely patent within the neck without stenosis, dissection or occlusion. Skeleton: No acute osseous abnormality. No discrete lytic or blastic osseous lesions. Other neck: No other acute soft tissue abnormality within the neck. No adenopathy. Subcentimeter hypodense nodule noted within the left lobe of thyroid, for which no follow-up imaging is recommended. Upper chest: Visualized upper chest demonstrates no acute finding. Review of the MIP images confirms the above findings CTA HEAD FINDINGS Anterior circulation: Petrous segments patent bilaterally. Scattered atheromatous plaque within the cavernous/supraclinoid ICAs with associated mild to moderate diffuse narrowing. A1 segments patent bilaterally. Right A1 hypoplastic. Normal anterior communicating artery. Both ACAs demonstrate moderate to advanced diffuse atherosclerotic irregularity but are patent to their distal aspects without high-grade flow-limiting stenosis. Right ACA diffusely hypoplastic. No M1 stenosis or occlusion. Normal MCA bifurcations. Distal MCA branches well perfused and symmetric, although demonstrate extensive small vessel atheromatous irregularity. Posterior circulation: Vertebral arteries patent to the vertebrobasilar junction without high-grade stenosis. Posterior inferior cerebral arteries patent bilaterally. Short-segment moderate approximate 50% stenosis noted at the proximal basilar artery (series 6, image 126). Basilar otherwise patent to its  distal aspect without stenosis. Superior cerebral arteries patent bilaterally. Right PCA supplied via the basilar. Fetal type origin of the left PCA. Extensive atheromatous change throughout both posterior cerebral arteries, which remain perfused to their distal aspects without high-grade stenosis. Venous sinuses: Grossly patent allowing for timing the contrast bolus. Anatomic variants: Fetal type origin of the left PCA. Previously identified small subdural hemorrhage overlying the left cerebral convexity again seen, not significantly changed measuring up to 3 mm in maximal diameter. Review of the MIP images confirms the above findings IMPRESSION: 1. Negative CTA for emergent large vessel occlusion. 2. Extensive atheromatous irregularity throughout the intracranial circulation without proximal high-grade or correctable stenosis. 3. Mild-to-moderate atherosclerotic change about the carotid bifurcations without hemodynamically significant stenosis. 4. Diffuse tortuosity of the major arterial vasculature of the head and neck, suggesting chronic underlying hypertension. Critical Value/emergent results were called by telephone at the time of interpretation on 03/16/2019 at 10:09 pm to [facility] , who verbally acknowledged these results. Electronically Signed   By: Rise MuBenjamin  McClintock M.D.   On: 03/16/2019 22:51   CT Code Stroke CTA Neck W/WO contrast  Result Date: 03/16/2019 CLINICAL DATA:  Initial evaluation for acute stroke, aphasia. EXAM: CT ANGIOGRAPHY HEAD AND NECK TECHNIQUE: Multidetector CT imaging of the head and neck  was performed using the standard protocol during bolus administration of intravenous contrast. Multiplanar CT image reconstructions and MIPs were obtained to evaluate the vascular anatomy. Carotid stenosis measurements (when applicable) are obtained utilizing NASCET criteria, using the distal internal carotid diameter as the denominator. CONTRAST:  197mL OMNIPAQUE IOHEXOL 350 MG/ML  SOLN COMPARISON:  Prior CT from earlier same day. FINDINGS: CTA NECK FINDINGS Aortic arch: Visualized arch ectatic measuring up to 3.8 cm in diameter. Mild to moderate plaque within the arch itself. No hemodynamically significant stenosis seen about the origin of the great vessels. Visualized subclavian arteries patent. Right carotid system: Right CCA patent from its origin to the bifurcation without stenosis. Scattered calcified plaque about the right bifurcation without hemodynamically significant stenosis. Right ICA tortuous proximally but widely patent to the skull base without stenosis, dissection or occlusion. Left carotid system: Left CCA widely patent from its origin to the bifurcation without stenosis. Scattered mixed plaque about the left bifurcation/proximal left ICA without hemodynamically significant stenosis. Left ICA mildly tortuous but widely patent distally to the skull base without stenosis, dissection, or occlusion. Vertebral arteries: Both vertebral arteries arise from the subclavian arteries. Atheromatous plaque at the origin of the left vertebral artery with secondary mild approximate 30% stenosis. Vertebral arteries otherwise widely patent within the neck without stenosis, dissection or occlusion. Skeleton: No acute osseous abnormality. No discrete lytic or blastic osseous lesions. Other neck: No other acute soft tissue abnormality within the neck. No adenopathy. Subcentimeter hypodense nodule noted within the left lobe of thyroid, for which no follow-up imaging is recommended. Upper chest: Visualized upper chest demonstrates no acute finding. Review of the MIP images confirms the above findings CTA HEAD FINDINGS Anterior circulation: Petrous segments patent bilaterally. Scattered atheromatous plaque within the cavernous/supraclinoid ICAs with associated mild to moderate diffuse narrowing. A1 segments patent bilaterally. Right A1 hypoplastic. Normal anterior communicating artery. Both ACAs  demonstrate moderate to advanced diffuse atherosclerotic irregularity but are patent to their distal aspects without high-grade flow-limiting stenosis. Right ACA diffusely hypoplastic. No M1 stenosis or occlusion. Normal MCA bifurcations. Distal MCA branches well perfused and symmetric, although demonstrate extensive small vessel atheromatous irregularity. Posterior circulation: Vertebral arteries patent to the vertebrobasilar junction without high-grade stenosis. Posterior inferior cerebral arteries patent bilaterally. Short-segment moderate approximate 50% stenosis noted at the proximal basilar artery (series 6, image 126). Basilar otherwise patent to its distal aspect without stenosis. Superior cerebral arteries patent bilaterally. Right PCA supplied via the basilar. Fetal type origin of the left PCA. Extensive atheromatous change throughout both posterior cerebral arteries, which remain perfused to their distal aspects without high-grade stenosis. Venous sinuses: Grossly patent allowing for timing the contrast bolus. Anatomic variants: Fetal type origin of the left PCA. Previously identified small subdural hemorrhage overlying the left cerebral convexity again seen, not significantly changed measuring up to 3 mm in maximal diameter. Review of the MIP images confirms the above findings IMPRESSION: 1. Negative CTA for emergent large vessel occlusion. 2. Extensive atheromatous irregularity throughout the intracranial circulation without proximal high-grade or correctable stenosis. 3. Mild-to-moderate atherosclerotic change about the carotid bifurcations without hemodynamically significant stenosis. 4. Diffuse tortuosity of the major arterial vasculature of the head and neck, suggesting chronic underlying hypertension. Critical Value/emergent results were called by telephone at the time of interpretation on 03/16/2019 at 10:09 pm to [facility] , who verbally acknowledged these results. Electronically  Signed   By: Jeannine Boga M.D.   On: 03/16/2019 22:51   CT HEAD CODE STROKE WO CONTRAST  Result Date:  03/16/2019 CLINICAL DATA:  Code stroke. Acute presentation with aphasia. Last known normal 1710 hours EXAM: CT HEAD WITHOUT CONTRAST TECHNIQUE: Contiguous axial images were obtained from the base of the skull through the vertex without intravenous contrast. COMPARISON:  None. FINDINGS: Brain: No sign of acute infarction. Mild generalized age related volume loss. No focal abnormality seen affecting the brainstem. 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 Old lacunar infarctions in the right basal ganglia. Minimal small vessel change of the white matter elsewhere. No mass lesion. The patient has a thin subdural hematoma on the left, maximal thickness 3 mm. No mass-effect upon the brain. Vascular: There is atherosclerotic calcification of the major vessels at the base of the brain. Skull: Negative Sinuses/Orbits: Clear/normal Other: None ASPECTS (Alberta Stroke Program Early CT Score) - Ganglionic level infarction (caudate, lentiform nuclei, internal capsule, insula, M1-M3 cortex): 7 - Supraganglionic infarction (M4-M6 cortex): 3 Total score (0-10 with 10 being normal): 10 IMPRESSION: 1. No sign of acute infarction. 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 2. Thin subdural hematoma on the left, maximal thickness 3 mm. No apparent mass effect. 3. ASPECTS is 10 4. These results were called by telephone at the time of interpretation on 03/16/2019 at 7:02 pm to provider [facility] , who verbally acknowledged these results. Electronically Signed   By: Paulina Fusi M.D.   On: 03/16/2019 19:04     MDM   Dr. Georg Ruddle of neurohospitalist at bedside to evaluate patient, ordered CTA studies. CTA does not show evidence of LVO.  Per Dr. Otelia Limes recommends admission to medical floor for MRI and additional evaluation.    I spoke with Dr.  Precious Reel who will see patient for admission.    Patient seen as a shared visit with Dr. Pilar Plate.    Patient remained hemodynamically stable while in my care.         Cristina Gong, PA-C 03/16/19 2357    Sabas Sous, MD 03/17/19 1537

## 2019-03-16 NOTE — Consult Note (Signed)
[facility] Tele[facility]urology Consult Services   Date of Service:   03/16/2019 18:50:18  Impression:     .  I62.0 - Subdural hemorrhage (acute)(nontraumatic)  Comments/Sign-Out: No reported trauma. Exam is expressive aphasia. Recommend consulting neurosurgery for further management  Metrics: Last Known Well: 03/16/2019 17:00:00 [facility] Notification Time: 03/16/2019 18:50:18 Arrival Time: 03/16/2019 18:39:00 Stamp Time: 03/16/2019 18:50:18 Time First Login Attempt: 03/16/2019 18:54:34 Symptoms: speech changes NIHSS Start Assessment Time: 03/16/2019 18:56:46 Patient is not a candidate for Alteplase/Activase. Patient was not deemed candidate for Alteplase/Activase thrombolytics because of Current or Previous ICH.  CT head was reviewed and results were: left 75mm subdural hematoma  Clinical Presentation is not Suggestive of Large Vessel Occlusive Disease  ED Physician notified of diagnostic impression and management plan on 03/16/2019 19:06:01  Our recommendations are outlined below.  Recommendations:     .  Activate Stroke Protocol Admission/Order Set     .  Stroke/Telemetry Floor     .  Neuro Checks     .  Bedside Swallow Eval     .  DVT Prophylaxis     .  IV Fluids, Normal Saline     .  Head of Bed 30 Degrees     .  Euglycemia and Avoid Hyperthermia (PRN Acetaminophen)     .  Hold Antithrombotics for Now   Sign Out:     .  Discussed with Emergency Department Provider    ------------------------------------------------------------------------------  History of Present Illness: Patient is a 85 year old Female.  Patient was brought by private transportation with symptoms of speech changes  85 yo F with history of HL who is presenting with non-verbal. She went to the doctor and was dizzy and light headed and she stopped speaking at 17:10. She had a headache yesterday. She has been having spells of dizziness for the last few days.   Past  Medical History:     . Hyperlipidemia     . There is NO history of Stroke  Anticoagulant use:  No  Antiplatelet use: No    Examination: BP(161/76), Pulse(82), Blood Glucose(124) 1A: Level of Consciousness - Alert; keenly responsive + 0 1B: Ask Month and Age - Both Questions Right + 0 1C: Blink Eyes & Squeeze Hands - Performs Both Tasks + 0 2: Test Horizontal Extraocular Movements - Normal + 0 3: Test Visual Fields - No Visual Loss + 0 4: Test Facial Palsy (Use Grimace if Obtunded) - Normal symmetry + 0 5A: Test Left Arm Motor Drift - No Drift for 10 Seconds + 0 5B: Test Right Arm Motor Drift - No Drift for 10 Seconds + 0 6A: Test Left Leg Motor Drift - No Drift for 5 Seconds + 0 6B: Test Right Leg Motor Drift - No Drift for 5 Seconds + 0 7: Test Limb Ataxia (FNF/Heel-Shin) - No Ataxia + 0 8: Test Sensation - Normal; No sensory loss + 0 9: Test Language/Aphasia - Mild-Moderate Aphasia: Some Obvious Changes, Without Significant Limitation + 1 10: Test Dysarthria - Mild-Moderate Dysarthria: Slurring but can be understood + 1 11: Test Extinction/Inattention - No abnormality + 0  NIHSS Score: 2  Pre-Morbid Modified Ranking Scale: 0 Points = No symptoms at all   Patient/Family was informed the Neurology Consult would happen via TeleHealth consult by way of interactive audio and video telecommunications and consented to receiving care in this manner.   Due to the immediate potential for life-threatening deterioration due to underlying acute neurologic illness, I spent 25 minutes  providing critical care. This time includes time for face to face visit via telemedicine, review of medical records, imaging studies and discussion of findings with providers, the patient and/or family.   Dr Ginette Pitman   [facility] [phone]   Case [id number]

## 2019-03-16 NOTE — ED Triage Notes (Signed)
Pt's family says he was out of town this morning and pt called him stating she didn't feel right but nothing specific.  Went to pcp today and approx 4:30 pt started having difficulty speaking.  Family says pt fell 2 days ago and fell again getting out of family's car.

## 2019-03-16 NOTE — Progress Notes (Signed)
Code stroke documentation - time Call time - [place] time = 1840 Exam started 1844 Exam finished 1847 Images sent to [facility] Exam completed in epic 1849 gra called 1849

## 2019-03-16 NOTE — Consult Note (Addendum)
NEURO HOSPITALIST CONSULT NOTE   Requestig physician: Dr. Pilar PlateBero  Reason for Consult: Acute onset of aphasia  History obtained from:    Patient and Chart    HPI:                                                                                                                                          Paula Hanson is an 85 y.o. female who presented to the [facility] ED with acute onset of dysphasia after a fall with bruising of her right periorbital region. A thin left frontal subdural hematoma was seen on CT at OSH, but no acute brain parenchymal hypodensity or intraparenchymal hemorrhage was present. Due to persisting expressive dysphasia, she was emerg[facility] as a Code Stroke.   Her speech was fully resolved on arrival, except for mild intermittent dysarthria which was felt most likely to be due to the dry oral mucosa noted on exam.   NIHSS: 1, for dysarthria  She fell one week ago at home after "losing my balance", striking her right periorbital region against a dresser or chest of drawers. She lost consciousness with that event but did not seek medical evaluation at that time. She fell again today and then could not get up, which she thinks may have prompted her visit to the [facility] ED, but she is not sure, stating that she is having trouble remembering recent events.   She states that she has been having transient perceptions of "as though I am leaving my body" for about one year. These can occur when sitting, lying or standing and are not associated with presyncope; she states that there is no accompanying jerking or twitching. Does not have vertigo with these spells either.    She denies current headache, although she has been experiencing headaches recently. No vision changes, facial droop, CP, SOB, limb weakness or limb numbness.   Past Medical History:  Diagnosis Date  . Acid reflux   . Hypercholesteremia     Past Surgical History:  Procedure  Laterality Date  . ABDOMINAL HYSTERECTOMY    . BREAST SURGERY    . CHOLECYSTECTOMY      No family history on file.            Social History:  reports that she has never smoked. She has never used smokeless tobacco. She reports that she does not drink alcohol. No history on file for drug.  No Known Allergies  MEDICATIONS:  No current facility-administered medications on file prior to encounter.   Current Outpatient Medications on File Prior to Encounter  Medication Sig Dispense Refill  . atorvastatin (LIPITOR) 40 MG tablet Take 40 mg by mouth daily.    . calcium carbonate (OSCAL) 1500 (600 Ca) MG TABS tablet Take by mouth 2 (two) times daily with a meal.    . cholecalciferol (VITAMIN D) 1000 units tablet Take 1,000 Units by mouth 2 (two) times daily.    Marland Kitchen labetalol (NORMODYNE) 200 MG tablet Take 200 mg by mouth 2 (two) times daily.    . meloxicam (MOBIC) 7.5 MG tablet Take 7.5 mg by mouth daily.    . naproxen (NAPROSYN) 500 MG tablet Take 1 tablet (500 mg total) by mouth 2 (two) times daily with a meal. 60 tablet 5  . Omega-3 Fatty Acids (FISH OIL PO) Take 3 capsules by mouth daily.    Marland Kitchen omeprazole (PRILOSEC) 40 MG capsule Take 1 capsule by mouth daily.    . vitamin B-12 (CYANOCOBALAMIN) 1000 MCG tablet Take 1,000 mcg by mouth daily.    Marland Kitchen ALPRAZolam (XANAX) 0.5 MG tablet Take 0.5 mg by mouth at bedtime as needed for anxiety.       ROS:                                                                                                                                       As per HPI. Comprehensive ROS otherwise negative.    Blood pressure (!) 172/75, pulse 84, temperature 97.6 F (36.4 C), temperature source Temporal, resp. rate (!) 21, SpO2 95 %.   General Examination:                                                                                                        Physical Exam  HEENT-  Bruise to right periorbital region appears a few days old, given green discoloration. Decreased hydration of oral mucosa.  Lungs- Respirations unlabored Extremities- No edema  Neurological Examination Mental Status: Alert, fully oriented, thought content appropriate.  Speech fluent with in the context of a relatively prominent tendency to speak rapidly without substantial pauses between words - she states that her speech is normal and at baseline. Intact naming for common and uncommon words. Had some mild dysarthria which resolved after drinking a cup of water. Repetition intact. Comprehension intact. Able to follow all commands without difficulty. Cranial Nerves: II: Visual fields intact with no extinction to DSS. PERRL.  III,IV, VI:  Ptosis not present. EOMI. No nystagmus.  V,VII: No facial droop. Temp sensation equal bilaterally VIII: hearing intact to conversation IX,X: No hoarseness or hypophonia.  XI: Symmetric XII: midline tongue extension Motor: Right : Upper extremity   5/5    Left:     Upper extremity   5/5  Lower extremity   5/5     Lower extremity   5/5 Normal tone throughout; no atrophy noted No pronator drift.  Sensory: Temp and light touch intact throughout, bilaterally. No extinction Deep Tendon Reflexes: 2+ and symmetric throughout Cerebellar: No ataxia with FNF bilaterally  Gait: Deferred   Lab Results: Basic Metabolic Panel: Recent Labs  Lab 03/16/19 1851 03/16/19 1852  NA 133* 134*  K 4.3 4.3  CL 99 99  CO2 20*  --   GLUCOSE 127* 124*  BUN 19 19  CREATININE 1.23* 1.20*  CALCIUM 9.1  --     CBC: Recent Labs  Lab 03/16/19 1851 03/16/19 1852  WBC 11.6*  --   NEUTROABS 9.4*  --   HGB 12.2 12.9  HCT 37.1 38.0  MCV 89.0  --   PLT 276  --     Cardiac Enzymes: No results for input(s): CKTOTAL, CKMB, CKMBINDEX, TROPONINI in the last 168 hours.  Lipid Panel: No results for input(s): CHOL, TRIG, HDL, CHOLHDL, VLDL, LDLCALC  in the last 182 hours.  Imaging: CT HEAD CODE STROKE WO CONTRAST  Result Date: 03/16/2019 CLINICAL DATA:  Code stroke. Acute presentation with aphasia. Last known normal 1710 hours EXAM: CT HEAD WITHOUT CONTRAST TECHNIQUE: Contiguous axial images were obtained from the base of the skull through the vertex without intravenous contrast. COMPARISON:  None. FINDINGS: Brain: No sign of acute infarction. Mild generalized age related volume loss. No focal abnormality seen affecting the brainstem. 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 Old lacunar infarctions in the right basal ganglia. Minimal small vessel change of the white matter elsewhere. No mass lesion. The patient has a thin subdural hematoma on the left, maximal thickness 3 mm. No mass-effect upon the brain. Vascular: There is atherosclerotic calcification of the major vessels at the base of the brain. Skull: Negative Sinuses/Orbits: Clear/normal Other: None ASPECTS (Alberta Stroke Program Early CT Score) - Ganglionic level infarction (caudate, lentiform nuclei, internal capsule, insula, M1-M3 cortex): 7 - Supraganglionic infarction (M4-M6 cortex): 3 Total score (0-10 with 10 being normal): 10 IMPRESSION: 1. No sign of acute infarction. 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 2. Thin subdural hematoma on the left, maximal thickness 3 mm. No apparent mass effect. 3. ASPECTS is 10 4. These results were called by telephone at the time of interpretation on 03/16/2019 at 7:02 pm to provider [facility] , who verbally acknowledged these results. Electronically Signed   By: Paulina Fusi M.D.   On: 03/16/2019 19:04    Assessment: 85 year old female presenting with acute onset of dysphasia after a fall with bruising of right periorbital region. Thin left frontal subdural hematoma was seen on CT at OSH. Due to persisting expressive dysphasia, she was emergently transferred to the [facility] ED as  a Code Stroke.  1. Exam reveals intact speech comprehension, repetition and naming. However, there is some difficulty with right-left confusion.  2. CT head at OSH: No sign of acute infarction. 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 There is a thin subdural hematoma on the left, maximal thickness 3  mm, with no mass effect.  3. STAT CTA of head and neck: Atherosclerotic stenoses distally. No high grade proximal stenosis or LVO.  4. The small subdural hematoma is not felt to be large enough to have resulted in an aphasia, which raised suspicion for possible stroke while she was at [facility]. Also on the DDx would be an unwitnessed partial complex seizure due to the hematoma, with subsequent postictal confusion.   Recommendations: 1. MRI brain.  2. Traumatic subdural hematoma is small enough for patient to be admitted to a regular medical floor. She is neurologically stable.  3. TTE 4. Hold off on starting ASA for one week. If CT head shows stable subdural hematoma after 7 days, consider starting ASA as her CTA head shows intracranial atherosclerosis, increasing her risk for ischemic stroke.   5. Cardiac telemetry.  6. PT/OT/Speech 7. Permissive HTN x 24 hours. Use modified protocol given advanced age. Correct SBP if > 180.  8. EEG in AM (ordered)    Electronically signed: Dr. Caryl Pina 03/16/2019, 9:54 PM

## 2019-03-16 NOTE — ED Provider Notes (Addendum)
[facility] EMERGENCY DEPARTMENT Provider Note   CSN: [id number] Arrival date & time: 03/16/19  1839  An emergency department physician performed an initial assessment on this suspected stroke patient at 1845.  History Chief Complaint  Patient presents with  . Aphasia    Paula Hanson is a 85 y.o. female.  HPI   This patient is an 85 year old female accompanied by her son who is the primary historian as the patient has an expressive aphasia.  She is 85 years old, has a history of hypercholesterolemia and acid reflux, according to the medical history that I have here she takes a combination of Mobic, vitamin D, Lipitor, Xanax, vitamin B, omeprazole.  She has evidently never had a stroke but has had some increasing dizziness as of recently.  The son reports that this has been somewhat intermittent as she has had some falls but when he went to see her today she was able to walk into the doctor's office with him.  She wanted to go to the doctor because she was "not feeling well" this was vague and unclear however when she got to the doctor's office the doctor saw that she was probably having a stroke and wrote admission orders for her to go to the hospital.  However the son did not want to go to that hospital and brought her here instead.  We do not have the notes from the physician who saw her in the office at 5:00.  The son reports that she had a discrete change in her speech that happened at approximately 5:10 when she had another fall.  The patient is not able to tell me anything as she has a dense expressive aphasia.  The son has not been with her recently, he has been in [place] and came home today to see her.  There is no other historians at the bedside.  Timing is unclear, it is unclear whether the dizziness was the first symptom or whether the speech today was the symptom however in the office the doctor who saw her thought she was having a stroke prior to the patient's son stating that the  speech change.  Past Medical History:  Diagnosis Date  . Acid reflux   . Hypercholesteremia     There are no problems to display for this patient.   Past Surgical History:  Procedure Laterality Date  . ABDOMINAL HYSTERECTOMY    . BREAST SURGERY    . CHOLECYSTECTOMY       OB History   No obstetric history on file.     No family history on file.  Social History   Tobacco Use  . Smoking status: Never Smoker  . Smokeless tobacco: Never Used  Substance Use Topics  . Alcohol use: No  . Drug use: Not on file    Home Medications Prior to Admission medications   Medication Sig Start Date End Date Taking? Authorizing Provider  ALPRAZolam Prudy Feeler) 0.5 MG tablet Take 0.5 mg by mouth at bedtime as needed for anxiety.    [provider]  atorvastatin (LIPITOR) 40 MG tablet Take 40 mg by mouth daily.    [provider]  calcium carbonate (OSCAL) 1500 (600 Ca) MG TABS tablet Take by mouth 2 (two) times daily with a meal.    [provider]  cholecalciferol (VITAMIN D) 1000 units tablet Take 1,000 Units by mouth 2 (two) times daily.    [provider]  labetalol (NORMODYNE) 200 MG tablet Take 200 mg by  mouth 2 (two) times daily.    [provider]  meloxicam (MOBIC) 7.5 MG tablet Take 7.5 mg by mouth daily.    [provider]  naproxen (NAPROSYN) 500 MG tablet Take 1 tablet (500 mg total) by mouth 2 (two) times daily with a meal. 01/08/16   Darreld Mclean, MD  Omega-3 Fatty Acids (FISH OIL PO) Take 3 capsules by mouth daily.    [provider]  omeprazole (PRILOSEC) 40 MG capsule Take 1 capsule by mouth daily. 03/29/13   [provider]  vitamin B-12 (CYANOCOBALAMIN) 1000 MCG tablet Take 1,000 mcg by mouth daily.    [provider]    Allergies    Patient has no known allergies.  Review of Systems   Review of Systems  Unable to perform ROS: Patient nonverbal    Physical Exam Updated Vital  Signs BP (!) 161/76 (BP Location: Left Arm)   Pulse 82   Temp 99 F (37.2 C) (Oral)   Resp 18   SpO2 96%   Physical Exam Vitals and nursing note reviewed.  Constitutional:      General: She is not in acute distress.    Appearance: She is well-developed. She is ill-appearing.  HENT:     Head: Normocephalic and atraumatic.     Mouth/Throat:     Pharynx: No oropharyngeal exudate.  Eyes:     General: No scleral icterus.       Right eye: No discharge.        Left eye: No discharge.     Conjunctiva/sclera: Conjunctivae normal.     Pupils: Pupils are equal, round, and reactive to light.  Neck:     Thyroid: No thyromegaly.     Vascular: No JVD.  Cardiovascular:     Rate and Rhythm: Normal rate and regular rhythm.     Heart sounds: Normal heart sounds. No murmur. No friction rub. No gallop.   Pulmonary:     Effort: Pulmonary effort is normal. No respiratory distress.     Breath sounds: Normal breath sounds. No wheezing or rales.  Abdominal:     General: Bowel sounds are normal. There is no distension.     Palpations: Abdomen is soft. There is no mass.     Tenderness: There is no abdominal tenderness.  Musculoskeletal:        General: No tenderness. Normal range of motion.     Cervical back: Normal range of motion and neck supple.  Lymphadenopathy:     Cervical: No cervical adenopathy.  Skin:    General: Skin is warm and dry.     Findings: No erythema or rash.  Neurological:     Mental Status: She is alert.     Coordination: Coordination normal.     Comments: The patient speech is garbled, she tries to talk but cannot say the words.  When I asked her to count my fingers in the peripheral visual field she is able to say the words 1 and 2 however when I ask her to tell me her name, her date of birth, where she was born, she cannot name a pen without is garbled speech.  She is able to lift all 4 extremities with normal strength, she can perform finger-nose-finger though she has a  slight difficulty time doing this with her right upper extremity.  She has no visible weakness and no pronator drift.  Her facial symmetry is normal, her cranial nerves III through XII appear normal and she has normal  extraocular movements without a disconjugate gaze.  Psychiatric:        Behavior: Behavior normal.     ED Results / Procedures / Treatments   Labs (all labs ordered are listed, but only abnormal results are displayed) Labs Reviewed  CBC - Abnormal; Notable for the following components:      Result Value   WBC 11.6 (*)    All other components within normal limits  DIFFERENTIAL - Abnormal; Notable for the following components:   Neutro Abs 9.4 (*)    Abs Immature Granulocytes 0.09 (*)    All other components within normal limits  COMPREHENSIVE METABOLIC PANEL - Abnormal; Notable for the following components:   Sodium 133 (*)    CO2 20 (*)    Glucose, Bld 127 (*)    Creatinine, Ser 1.23 (*)    Total Bilirubin 1.3 (*)    GFR calc non Af Amer 40 (*)    GFR calc Af Amer 46 (*)    All other components within normal limits  CBG MONITORING, ED - Abnormal; Notable for the following components:   Glucose-Capillary 126 (*)    All other components within normal limits  I-STAT CHEM 8, ED - Abnormal; Notable for the following components:   Sodium 134 (*)    Creatinine, Ser 1.20 (*)    Glucose, Bld 124 (*)    All other components within normal limits  PROTIME-INR  APTT  CBG MONITORING, ED  POC SARS CORONAVIRUS 2 AG -  ED    EKG EKG Interpretation  Date/Time:  Thursday March 16 2019 18:46:58 EST Ventricular Rate:  76 PR Interval:  172 QRS Duration: 76 QT Interval:  390 QTC Calculation: 438 R Axis:   -33 Text Interpretation: Normal sinus rhythm Left axis deviation Abnormal ECG since last tracing no significant change Confirmed by Eber HongMiller, Edeline Greening ([id number]) on 03/16/2019 6:55:07 PM   Radiology CT HEAD CODE STROKE WO CONTRAST  Result Date: 03/16/2019 CLINICAL DATA:   Code stroke. Acute presentation with aphasia. Last known normal 1710 hours EXAM: CT HEAD WITHOUT CONTRAST TECHNIQUE: Contiguous axial images were obtained from the base of the skull through the vertex without intravenous contrast. COMPARISON:  None. FINDINGS: Brain: No sign of acute infarction. Mild generalized age related volume loss. No focal abnormality seen affecting the brainstem. 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 Old lacunar infarctions in the right basal ganglia. Minimal small vessel change of the white matter elsewhere. No mass lesion. The patient has a thin subdural hematoma on the left, maximal thickness 3 mm. No mass-effect upon the brain. Vascular: There is atherosclerotic calcification of the major vessels at the base of the brain. Skull: Negative Sinuses/Orbits: Clear/normal Other: None ASPECTS (Alberta Stroke Program Early CT Score) - Ganglionic level infarction (caudate, lentiform nuclei, internal capsule, insula, M1-M3 cortex): 7 - Supraganglionic infarction (M4-M6 cortex): 3 Total score (0-10 with 10 being normal): 10 IMPRESSION: 1. No sign of acute infarction. 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 2. Thin subdural hematoma on the left, maximal thickness 3 mm. No apparent mass effect. 3. ASPECTS is 10 4. These results were called by telephone at the time of interpretation on 03/16/2019 at 7:02 pm to provider [facility] Parish Dubose , who verbally acknowledged these results. Electronically Signed   By: Paulina FusiMark  Shogry M.D.   On: 03/16/2019 19:04    Procedures .Critical Care Performed by: Eber HongMiller, Yamaira Spinner, MD Authorized by: Eber HongMiller, Jaimee Corum, MD   Critical care  provider statement:    Critical care time (minutes):  35   Critical care time was exclusive of:  Separately billable procedures and treating other patients and teaching time   Critical care was necessary to treat or prevent imminent or life-threatening deterioration of  the following conditions:  CNS failure or compromise and trauma   Critical care was time spent personally by me on the following activities:  Blood draw for specimens, development of treatment plan with patient or surrogate, discussions with consultants, evaluation of patient's response to treatment, examination of patient, obtaining history from patient or surrogate, ordering and performing treatments and interventions, ordering and review of laboratory studies, ordering and review of radiographic studies, pulse oximetry, re-evaluation of patient's condition and review of old charts   (including critical care time)  Medications Ordered in ED Medications  sodium chloride flush (NS) 0.9 % injection 3 mL (has no administration in time range)    ED Course  I have reviewed the triage vital signs and the nursing notes.  Pertinent labs & imaging results that were available during my care of the patient were reviewed by me and considered in my medical decision making (see chart for details).  Clinical Course as of Mar 26 1749  Thu Mar 16, 2019  1903 D/w radiology  L subdurah hematoma - 3mm in sizm No shift    [BM]  2023 Gust the case with Dr. Johnsie Cancel of the neurosurgical service who agrees that at this time he would not do any surgery however she would need to come to [facility], be cared for by the neurology team as well, if the bleeding got bigger or she became more unstable he would consider draining the subdural but at this time he states he would not operate.  Will discuss with neuro hospitalist   [BM]  2319 I spoke with Dr. Otelia Limes who recommends floor admission.  I spoke with Dr. Precious Reel who will see patient for admission.    [EH]    Clinical Course User Index [BM] Eber Hong, MD [EH] Cristina Gong, PA-C   MDM Rules/Calculators/A&P                      This patient speech is definitely off, there is an expressive aphasia, unfortunately we cannot definitively  establish a timeframe.  The patient did have normal speech yesterday according to the son.  I have discussed the care with the teleneurologist, the neurosurgeon and now the neuro hospitalist, Dr. Otelia Limes who who I have spoken with and agrees that this patient should be transported to [facility] to the stroke center to receive formal evaluation for possible MCA occlusion due to the difficulty with speech.  This patient is critically ill, she has a subdural hematoma in though she is not amenable to surgery at this time she may have an intravascular problem as well.  We will transport her emergently by EMS from our emergency department to the [facility] emergency department.  Dr. Fredderick Phenix aware of pt coming ED to ED for LVO eval.  Paula Hanson was evaluated in Emergency Department on 03/16/2019 for the symptoms described in the history of present illness. She was evaluated in the context of the global COVID-19 pandemic, which necessitated consideration that the patient might be at risk for infection with the SARS-CoV-2 virus that causes COVID-19. Institutional protocols and algorithms that pertain to the evaluation of patients at risk for COVID-19 are in a state of rapid change  based on information released by regulatory bodies including the CDC and federal and state organizations. These policies and algorithms were followed during the patient's care in the ED.  Final Clinical Impression(s) / ED Diagnoses Final diagnoses:  Subdural hematoma ([place])     Noemi Chapel, MD 03/16/19 2040    Noemi Chapel, MD 03/16/19 2122    Noemi Chapel, MD 03/27/19 1751

## 2019-03-17 ENCOUNTER — Observation Stay (HOSPITAL_COMMUNITY): Payer: Medicare Other

## 2019-03-17 ENCOUNTER — Observation Stay (HOSPITAL_BASED_OUTPATIENT_CLINIC_OR_DEPARTMENT_OTHER): Payer: Medicare Other

## 2019-03-17 ENCOUNTER — Encounter (HOSPITAL_COMMUNITY): Payer: Self-pay | Admitting: Internal Medicine

## 2019-03-17 DIAGNOSIS — I6529 Occlusion and stenosis of unspecified carotid artery: Secondary | ICD-10-CM | POA: Diagnosis present

## 2019-03-17 DIAGNOSIS — Z9049 Acquired absence of other specified parts of digestive tract: Secondary | ICD-10-CM | POA: Diagnosis not present

## 2019-03-17 DIAGNOSIS — E785 Hyperlipidemia, unspecified: Secondary | ICD-10-CM | POA: Diagnosis present

## 2019-03-17 DIAGNOSIS — I672 Cerebral atherosclerosis: Secondary | ICD-10-CM | POA: Diagnosis present

## 2019-03-17 DIAGNOSIS — R471 Dysarthria and anarthria: Secondary | ICD-10-CM | POA: Diagnosis present

## 2019-03-17 DIAGNOSIS — Z79899 Other long term (current) drug therapy: Secondary | ICD-10-CM | POA: Diagnosis not present

## 2019-03-17 DIAGNOSIS — I351 Nonrheumatic aortic (valve) insufficiency: Secondary | ICD-10-CM | POA: Diagnosis not present

## 2019-03-17 DIAGNOSIS — Z20822 Contact with and (suspected) exposure to covid-19: Secondary | ICD-10-CM | POA: Diagnosis present

## 2019-03-17 DIAGNOSIS — N179 Acute kidney failure, unspecified: Secondary | ICD-10-CM | POA: Diagnosis not present

## 2019-03-17 DIAGNOSIS — R29702 NIHSS score 2: Secondary | ICD-10-CM | POA: Diagnosis present

## 2019-03-17 DIAGNOSIS — I951 Orthostatic hypotension: Secondary | ICD-10-CM | POA: Diagnosis not present

## 2019-03-17 DIAGNOSIS — W1830XA Fall on same level, unspecified, initial encounter: Secondary | ICD-10-CM | POA: Diagnosis present

## 2019-03-17 DIAGNOSIS — E7800 Pure hypercholesterolemia, unspecified: Secondary | ICD-10-CM | POA: Diagnosis not present

## 2019-03-17 DIAGNOSIS — I350 Nonrheumatic aortic (valve) stenosis: Secondary | ICD-10-CM

## 2019-03-17 DIAGNOSIS — I10 Essential (primary) hypertension: Secondary | ICD-10-CM | POA: Diagnosis not present

## 2019-03-17 DIAGNOSIS — H9190 Unspecified hearing loss, unspecified ear: Secondary | ICD-10-CM | POA: Diagnosis present

## 2019-03-17 DIAGNOSIS — R4701 Aphasia: Secondary | ICD-10-CM | POA: Diagnosis not present

## 2019-03-17 DIAGNOSIS — K219 Gastro-esophageal reflux disease without esophagitis: Secondary | ICD-10-CM | POA: Diagnosis present

## 2019-03-17 DIAGNOSIS — I6200 Nontraumatic subdural hemorrhage, unspecified: Secondary | ICD-10-CM | POA: Diagnosis not present

## 2019-03-17 DIAGNOSIS — Z90710 Acquired absence of both cervix and uterus: Secondary | ICD-10-CM | POA: Diagnosis not present

## 2019-03-17 DIAGNOSIS — Z791 Long term (current) use of non-steroidal anti-inflammatories (NSAID): Secondary | ICD-10-CM | POA: Diagnosis not present

## 2019-03-17 DIAGNOSIS — I739 Peripheral vascular disease, unspecified: Secondary | ICD-10-CM | POA: Diagnosis present

## 2019-03-17 DIAGNOSIS — R296 Repeated falls: Secondary | ICD-10-CM | POA: Diagnosis present

## 2019-03-17 DIAGNOSIS — E860 Dehydration: Secondary | ICD-10-CM | POA: Diagnosis present

## 2019-03-17 DIAGNOSIS — R682 Dry mouth, unspecified: Secondary | ICD-10-CM | POA: Diagnosis present

## 2019-03-17 DIAGNOSIS — R4702 Dysphasia: Secondary | ICD-10-CM | POA: Diagnosis present

## 2019-03-17 DIAGNOSIS — F419 Anxiety disorder, unspecified: Secondary | ICD-10-CM | POA: Diagnosis present

## 2019-03-17 DIAGNOSIS — S065X9A Traumatic subdural hemorrhage with loss of consciousness of unspecified duration, initial encounter: Secondary | ICD-10-CM | POA: Diagnosis not present

## 2019-03-17 LAB — ECHOCARDIOGRAM COMPLETE
Height: 64 in
Weight: 2687.85 oz

## 2019-03-17 LAB — LIPID PANEL
Cholesterol: 205 mg/dL — ABNORMAL HIGH (ref 0–200)
HDL: 45 mg/dL (ref >40)
LDL Cholesterol: 139 mg/dL — ABNORMAL HIGH (ref 0–99)
Total CHOL/HDL Ratio: 4.6 RATIO
Triglycerides: 107 mg/dL (ref <150)
VLDL: 21 mg/dL (ref 0–40)

## 2019-03-17 LAB — RESPIRATORY PANEL BY RT PCR (FLU A&B, COVID)
Influenza A by PCR: NEGATIVE
Influenza B by PCR: NEGATIVE
SARS Coronavirus 2 by RT PCR: NEGATIVE

## 2019-03-17 LAB — HEMOGLOBIN A1C
Hgb A1c MFr Bld: 5.3 % (ref 4.8–5.6)
Mean Plasma Glucose: 105.41 mg/dL

## 2019-03-17 MED ORDER — ASPIRIN 325 MG PO TABS: 325.0000 mg | ORAL_TABLET | Freq: Every day | ORAL | Status: DC

## 2019-03-17 MED ORDER — STROKE: EARLY STAGES OF RECOVERY BOOK
Freq: Once | Status: AC
  Filled 2019-03-17: qty 1

## 2019-03-17 MED ORDER — SODIUM CHLORIDE 0.9 % IV SOLN: INTRAVENOUS | Status: AC

## 2019-03-17 MED ORDER — ASPIRIN 300 MG RE SUPP: 300.0000 mg | Freq: Every day | RECTAL | Status: DC

## 2019-03-17 MED ORDER — OMEGA-3-ACID ETHYL ESTERS 1 G PO CAPS
1.0000 g | ORAL_CAPSULE | Freq: Every day | ORAL | Status: DC
  Administered 2019-03-17 – 2019-03-18 (×2): 1 g via ORAL
  Filled 2019-03-17 (×2): qty 1

## 2019-03-17 MED ORDER — VITAMIN B-12 1000 MCG PO TABS
1000.0000 ug | ORAL_TABLET | Freq: Every day | ORAL | Status: DC
  Administered 2019-03-17 – 2019-03-18 (×2): 1000 ug via ORAL
  Filled 2019-03-17 (×2): qty 1

## 2019-03-17 MED ORDER — LABETALOL HCL 200 MG PO TABS
200.0000 mg | ORAL_TABLET | Freq: Two times a day (BID) | ORAL | Status: DC
  Administered 2019-03-17 – 2019-03-18 (×3): 200 mg via ORAL
  Filled 2019-03-17 (×3): qty 1

## 2019-03-17 MED ORDER — ACETAMINOPHEN 650 MG RE SUPP: 650.0000 mg | RECTAL | Status: DC | PRN

## 2019-03-17 MED ORDER — ATORVASTATIN CALCIUM 40 MG PO TABS
40.0000 mg | ORAL_TABLET | Freq: Every day | ORAL | Status: DC
  Administered 2019-03-17 – 2019-03-18 (×2): 40 mg via ORAL
  Filled 2019-03-17 (×2): qty 1

## 2019-03-17 MED ORDER — CALCIUM CARBONATE 1250 (500 CA) MG PO TABS
1250.0000 mg | ORAL_TABLET | Freq: Two times a day (BID) | ORAL | Status: DC
  Administered 2019-03-17 – 2019-03-18 (×3): 1250 mg via ORAL
  Filled 2019-03-17 (×4): qty 1

## 2019-03-17 MED ORDER — ENOXAPARIN SODIUM 40 MG/0.4ML ~~LOC~~ SOLN: 40.0000 mg | SUBCUTANEOUS | Status: DC

## 2019-03-17 MED ORDER — PANTOPRAZOLE SODIUM 40 MG PO TBEC
40.0000 mg | DELAYED_RELEASE_TABLET | Freq: Every day | ORAL | Status: DC
  Administered 2019-03-17 – 2019-03-18 (×2): 40 mg via ORAL
  Filled 2019-03-17 (×2): qty 1

## 2019-03-17 MED ORDER — ACETAMINOPHEN 160 MG/5ML PO SOLN: 650.0000 mg | ORAL | Status: DC | PRN

## 2019-03-17 MED ORDER — ACETAMINOPHEN 325 MG PO TABS
650.0000 mg | ORAL_TABLET | ORAL | Status: DC | PRN
  Administered 2019-03-17 – 2019-03-18 (×3): 650 mg via ORAL
  Filled 2019-03-17 (×3): qty 2

## 2019-03-17 NOTE — Procedures (Signed)
Patient Name: [name]  MRN: [id number]  Epilepsy Attending: Charlsie Quest  Referring Physician/Provider: Dr Caryl Pina Date: 03/17/2019 Duration: 24.16 mins  Patient history: 86 year old female presenting with acute onset of dysphasia, thin left frontal subdural hematoma was seen on CT. EEG to evaluate for seizure  Level of alertness: awake  AEDs during EEG study: None  Technical aspects: This EEG study was done with scalp electrodes positioned according to the 10-20 International system of electrode placement. Electrical activity was acquired at a sampling rate of 500Hz  and reviewed with a high frequency filter of 70Hz  and a low frequency filter of 1Hz . EEG data were recorded continuously and digitally stored.   DESCRIPTION:The posterior dominant rhythm consists of 7.5 Hz activity of moderate voltage (25-35 uV) seen predominantly in posterior head regions, symmetric and reactive to eye opening and eye closing.    Intermittent 4-5Hz  theta slowing was seen in left frontotemporal region. Physiologic photic driving was not seen during photic stimulation.  Hyperventilation was not performed.  ABNORMALITY - Intermittent slow, left frontotemporal  IMPRESSION: This study is suggestive of cortical dysfunction in left frontotemporal region likely secondary to underlying subdural hematoma. No seizures or epileptiform discharges were seen throughout the recording.  Kiah Vanalstine 

## 2019-03-17 NOTE — Progress Notes (Signed)
Pt arrived from ED. VSS. No complaints of pain. R eye bruised from recent fall. AOx4. NIH 1. Awaiting MRI. Pt updated on plan of care, will continue to monitor.  Margarito Liner, RN

## 2019-03-17 NOTE — Evaluation (Signed)
Physical Therapy Evaluation Patient Details Name: Paula Hanson MRN: [id number] DOB: 02/27/1931 Today's Date: 03/17/2019   History of Present Illness  Patient is a 86 y/o female who presents with episode of aphasia s/p fall at home. Tx from [facility] for concern for stroke. Head CT-7mm SDH on left. Brain MRI-unremarkable. NIH:1. PMH includes HTN, HLD, Hypercholesteremia.  Clinical Impression  Patient presents with generalized weakness, decreased activity tolerance, and impaired mobility s/p above. Pt reports being independent PTA and lives alone. Noted to have 2 falls in the last week, one from syncope and the other a mechanical fall. Today, mobility limited due to symptomatic orthostatic hypotension. Pt reports she sometimes get this feeling at home, "I feel like I am floating out of my body for a little bit." Pt with episode of this when standing with this PT needing to sit. Sitting BP 158/86 Standing BP 128/74 Sitting BP 157/79, symptomatic and needing to sit due to lightheadedness.  Tolerated short distance ambulation with Min A for balance/safety. Pt will likely need RW for support at home. Concerned about pt returning home alone due to safety awareness. Reports her son who lives close works. Would really recommend supervision for OOB mobility at this time as pt is a high fall risk. If family cannot provide assist, may consider SNF. Will follow acutely to maximize independence and mobility prior to return home.    Follow Up Recommendations Home health PT;Supervision for mobility/OOB    Equipment Recommendations  Rolling walker with 5" wheels    Recommendations for Other Services       Precautions / Restrictions Precautions Precautions: Fall Precaution Comments: orthostatic Restrictions Weight Bearing Restrictions: No      Mobility  Bed Mobility Overal bed mobility: Needs Assistance Bed Mobility: Supine to Sit     Supine to sit: Min guard;HOB elevated     General bed  mobility comments: Use of rails no assist needed, no dizziness.  Transfers Overall transfer level: Needs assistance Equipment used: None Transfers: Sit to/from Stand Sit to Stand: Min guard         General transfer comment: Min guard for safety. After a few seconds, pt not responding to thrapist and staring off into space, noted to be lightheaded and needed assist to sit down. Stood from [employer].  Ambulation/Gait Ambulation/Gait assistance: Min assist Gait Distance (Feet): 15 Feet Assistive device: 1 person hand held assist Gait Pattern/deviations: Step-through pattern;Decreased stride length;Trunk flexed Gait velocity: decreased Gait velocity interpretation: <1.31 ft/sec, indicative of household ambulator General Gait Details: Slow, unsteady gait holding onto the foot of the bed to walk around to the recliner. No episodes during walking however pt not the best reporter of symptoms.  Stairs            Wheelchair Mobility    Modified Rankin (Stroke Patients Only) Modified Rankin (Stroke Patients Only) Pre-Morbid Rankin Score: Slight disability Modified Rankin: Moderately severe disability     Balance Overall balance assessment: Needs assistance;History of Falls Sitting-balance support: Feet supported;No upper extremity supported Sitting balance-Leahy Scale: Good     Standing balance support: During functional activity;Single extremity supported Standing balance-Leahy Scale: Poor Standing balance comment: Requires Ue support in standing and more assist when symptomatic from BP drop.                             Pertinent Vitals/Pain Pain Assessment: No/denies pain    Home Living Family/patient expects to be discharged to::  Private residence Living Arrangements: Alone Available Help at Discharge: Family;Available PRN/intermittently Type of Home: House Home Access: Level entry     Home Layout: One level;Laundry or work area in [employer] : None      Prior Function Level of Independence: Independent         Comments: Does own ADLs, drives. Does some IADLs. Reports 2 falls- one she passed out and the other was mechanical.     Hand Dominance   Dominant Hand: Right    Extremity/Trunk Assessment   Upper Extremity Assessment Upper Extremity Assessment: Defer to OT evaluation    Lower Extremity Assessment Lower Extremity Assessment: Generalized weakness    Cervical / Trunk Assessment Cervical / Trunk Assessment: Kyphotic  Communication   Communication: Expressive difficulties  Cognition Arousal/Alertness: Awake/alert Behavior During Therapy: WFL for tasks assessed/performed Overall Cognitive Status: No family/caregiver present to determine baseline cognitive functioning                                 General Comments: Seems to be able to state course of events at ths hospital, [facility]. Concerned about pt's safety awareness/awareness of deficits. "it feels like i leave by body for a little while" when describing sensation.      General Comments General comments (skin integrity, edema, etc.): Sitting BP 158/86, Standing BP 128/74, SItting BP 157/79, symptomatic.    Exercises     Assessment/Plan    PT Assessment Patient needs continued PT services  PT Problem List Decreased mobility;Decreased safety awareness;Decreased balance;Decreased knowledge of use of DME;Decreased activity tolerance;Decreased cognition;Cardiopulmonary status limiting activity       PT Treatment Interventions Therapeutic activities;Gait training;Therapeutic exercise;Patient/family education;Balance training;Functional mobility training;Stair training;DME instruction    PT Goals (Current goals can be found in the Care Plan section)  Acute Rehab PT Goals Patient Stated Goal: to get better and go home PT Goal Formulation: With patient Time For Goal Achievement: 04/06/2019 Potential to Achieve Goals: Fair     Frequency Min 3X/week   Barriers to discharge Decreased caregiver support lives alone    Co-evaluation               AM-PAC PT "6 Clicks" Mobility  Outcome Measure Help needed turning from your back to your side while in a flat bed without using bedrails?: None Help needed moving from lying on your back to sitting on the side of a flat bed without using bedrails?: A Little Help needed moving to and from a bed to a chair (including a wheelchair)?: A Little Help needed standing up from a chair using your arms (e.g., wheelchair or bedside chair)?: A Little Help needed to walk in hospital room?: A Little Help needed climbing 3-5 steps with a railing? : A Little 6 Click Score: 19    End of Session Equipment Utilized During Treatment: Gait belt Activity Tolerance: Treatment limited secondary to medical complications (Comment)(orthostatic) Patient left: in chair;with call bell/phone within reach;with chair alarm set Nurse Communication: Mobility status;Other (comment)(BP) PT Visit Diagnosis: Dizziness and giddiness (R42);Difficulty in walking, not elsewhere classified (R26.2);Unsteadiness on feet (R26.81)    Time: 2585-2778 PT Time Calculation (min) (ACUTE ONLY): 23 min   Charges:   PT Evaluation $PT Eval Moderate Complexity: 1 Mod PT Treatments $Therapeutic Activity: 8-22 mins        Vale Haven, PT, DPT Acute Rehabilitation Services Pager [phone] Office [phone]      Blake Divine A Lanier Ensign 03/17/2019,  12:23 PM

## 2019-03-17 NOTE — Progress Notes (Signed)
Patient admitted after midnight, please see H&P.  Here with orthostatic syncope with resultant small SDH.  Home in AM if orthostatics negative

## 2019-03-17 NOTE — H&P (Signed)
History and Physical    Paula Hanson WIO:[id number] DOB: [birth year] DOA: 03/16/2019  PCP: Toma Deiters, MD  Patient coming from: Home.  Chief Complaint: Brief episode of aphasia.  History obtained from ER physician.  Unable to reach patient's son.  HPI: Paula Hanson is a 86 y.o. female with history of hypertension, hyperlipidemia was brought to the ER at [facility] for brief episode of difficulty talking.  Patient son had just released back from [place] and was checking on his mother when patient had a fall and at that time patient had difficulty talking and was brought to the ER after visiting the primary care.  In the ER patient initially was found to have aphasia unable to talk CT head was showing a subdural hematoma on the left side measuring 3 mm.  Tele neurology was consulted and the code stroke was activated and transferred to [facility].  Per patient's family and patient patient has been having frequent falls 1 was about 2 weeks ago and patient states she also has been examined right-sided temporal headache for which she has been taking some Aleve.  Denies any chest pain shortness of breath nausea vomiting or diarrhea.  ED Course: In the ER patient had a CT angiogram of the head and neck which does not show any large vessel obstruction but does show atherosclerotic disease.  EKG shows normal sinus rhythm.  Labs show increased creatinine from baseline presently it is around 1.2 WBC 11.6 sodium 133 Covid test was negative.  Neurology at this time feel that patient's aphasia has resolved but had some dysarthria from dry mouth.  Patient admitted for further observation to get MRI brain.  Review of Systems: As per HPI, rest all negative.   Past Medical History:  Diagnosis Date  . Acid reflux   . Hypercholesteremia     Past Surgical History:  Procedure Laterality Date  . ABDOMINAL HYSTERECTOMY    . BREAST SURGERY    . CHOLECYSTECTOMY       reports that she  has never smoked. She has never used smokeless tobacco. She reports that she does not drink alcohol. No history on file for drug.  No Known Allergies  Family History  Family history unknown: Yes    Prior to Admission medications   Medication Sig Start Date End Date Taking? Authorizing Provider  atorvastatin (LIPITOR) 40 MG tablet Take 40 mg by mouth daily.   Yes [provider]  calcium carbonate (OSCAL) 1500 (600 Ca) MG TABS tablet Take by mouth 2 (two) times daily with a meal.   Yes [provider]  cholecalciferol (VITAMIN D) 1000 units tablet Take 1,000 Units by mouth 2 (two) times daily.   Yes [provider]  labetalol (NORMODYNE) 200 MG tablet Take 200 mg by mouth 2 (two) times daily.   Yes [provider]  meloxicam (MOBIC) 7.5 MG tablet Take 7.5 mg by mouth daily.   Yes [provider]  naproxen (NAPROSYN) 500 MG tablet Take 1 tablet (500 mg total) by mouth 2 (two) times daily with a meal. 01/08/16  Yes Darreld Mclean, MD  Omega-3 Fatty Acids (FISH OIL PO) Take 3 capsules by mouth daily.   Yes [provider]  omeprazole (PRILOSEC) 40 MG capsule Take 1 capsule by mouth daily. 03/29/13  Yes [provider]  vitamin B-12 (CYANOCOBALAMIN) 1000 MCG tablet Take 1,000 mcg by mouth daily.   Yes [provider]  ALPRAZolam Prudy Feeler) 0.5 MG tablet  Take 0.5 mg by mouth at bedtime as needed for anxiety.    [provider]    Physical Exam: Constitutional: Moderately built and nourished. Vitals:   03/16/19 2300 03/16/19 2315 03/16/19 2339 03/17/19 0038  BP: (!) 146/89 133/69 118/64 (!) 148/81  Pulse:  70 96 86  Resp: (!) 22 (!) 25 20 20   Temp:    98.2 F (36.8 C)  TempSrc:    Oral  SpO2:  95% 97% 96%  Weight:    76.2 kg  Height:    5\' 4"  (1.626 m)   Eyes: Nonicteric no pallor. ENMT: No discharge from the ears eyes nose or mouth. Neck: No mass felt.  No neck rigidity. Respiratory: No rhonchi or  crepitations. Cardiovascular: S1-S2 heard. Abdomen: Soft nontender bowel sounds present. Musculoskeletal: No edema. Skin: No rash. Neurologic: Alert awake oriented to time place and person.  Moves all extremities 5 x 5.  No facial extremity diverticulum.  Pupils equal reactive light. Psychiatric: Appears normal per normal affect.   Labs on Admission: I have personally reviewed following labs and imaging studies  CBC: Recent Labs  Lab 03/16/19 1851 03/16/19 1852  WBC 11.6*  --   NEUTROABS 9.4*  --   HGB 12.2 12.9  HCT 37.1 38.0  MCV 89.0  --   PLT 276  --    Basic Metabolic Panel: Recent Labs  Lab 03/16/19 1851 03/16/19 1852  NA 133* 134*  K 4.3 4.3  CL 99 99  CO2 20*  --   GLUCOSE 127* 124*  BUN 19 19  CREATININE 1.23* 1.20*  CALCIUM 9.1  --    GFR: Estimated Creatinine Clearance: 33.6 mL/min (A) (by C-G formula based on SCr of 1.2 mg/dL (H)). Liver Function Tests: Recent Labs  Lab 03/16/19 1851  AST 15  ALT 14  ALKPHOS 55  BILITOT 1.3*  PROT 7.7  ALBUMIN 3.6   No results for input(s): LIPASE, AMYLASE in the last 168 hours. No results for input(s): AMMONIA in the last 168 hours. Coagulation Profile: Recent Labs  Lab 03/16/19 1851  INR 1.0   Cardiac Enzymes: No results for input(s): CKTOTAL, CKMB, CKMBINDEX, TROPONINI in the last 168 hours. BNP (last 3 results) No results for input(s): PROBNP in the last 8760 hours. HbA1C: No results for input(s): HGBA1C in the last 72 hours. CBG: Recent Labs  Lab 03/16/19 1842  GLUCAP 126*   Lipid Profile: No results for input(s): CHOL, HDL, LDLCALC, TRIG, CHOLHDL, LDLDIRECT in the last 72 hours. Thyroid Function Tests: No results for input(s): TSH, T4TOTAL, FREET4, T3FREE, THYROIDAB in the last 72 hours. Anemia Panel: No results for input(s): VITAMINB12, FOLATE, FERRITIN, TIBC, IRON, RETICCTPCT in the last 72 hours. Urine analysis: No results found for: COLORURINE, APPEARANCEUR, LABSPEC, PHURINE, GLUCOSEU,  HGBUR, BILIRUBINUR, KETONESUR, PROTEINUR, UROBILINOGEN, NITRITE, LEUKOCYTESUR Sepsis Labs: @LABRCNTIP (procalcitonin:4,lacticidven:4) ) Recent Results (from the past 240 hour(s))  Respiratory Panel by RT PCR (Flu A&B, Covid) - Nasopharyngeal Swab     Status: None   Collection Time: 03/16/19 11:40 PM   Specimen: Nasopharyngeal Swab  Result Value Ref Range Status   SARS Coronavirus 2 by RT PCR NEGATIVE NEGATIVE Final    Comment: (NOTE) SARS-CoV-2 target nucleic acids are NOT DETECTED. The SARS-CoV-2 RNA is generally detectable in upper respiratoy specimens during the acute phase of infection. The lowest concentration of SARS-CoV-2 viral copies this assay can detect is 131 copies/mL. A negative result does not preclude SARS-Cov-2 infection and should not be used as the sole basis  for treatment or other patient management decisions. A negative result may occur with  improper specimen collection/handling, submission of specimen other than nasopharyngeal swab, presence of viral mutation(s) within the areas targeted by this assay, and inadequate number of viral copies (<131 copies/mL). A negative result must be combined with clinical observations, patient history, and epidemiological information. The expected result is Negative. Fact Sheet for Patients:  https://www.moore.com/ Fact Sheet for Healthcare Providers:  https://www.young.biz/ This test is not yet ap proved or cleared by the [place] FDA and  has been authorized for detection and/or diagnosis of SARS-CoV-2 by FDA under an Emergency Use Authorization (EUA). This EUA will remain  in effect (meaning this test can be used) for the duration of the COVID-19 declaration under Section 564(b)(1) of the Act, 21 U.S.C. section 360bbb-3(b)(1), unless the authorization is terminated or revoked sooner.    Influenza A by PCR NEGATIVE NEGATIVE Final   Influenza B by PCR NEGATIVE NEGATIVE Final     Comment: (NOTE) The Xpert Xpress SARS-CoV-2/FLU/RSV assay is intended as an aid in  the diagnosis of influenza from Nasopharyngeal swab specimens and  should not be used as a sole basis for treatment. Nasal washings and  aspirates are unacceptable for Xpert Xpress SARS-CoV-2/FLU/RSV  testing. Fact Sheet for Patients: https://www.moore.com/ Fact Sheet for Healthcare Providers: https://www.young.biz/ This test is not yet approved or cleared by the [place] FDA and  has been authorized for detection and/or diagnosis of SARS-CoV-2 by  FDA under an Emergency Use Authorization (EUA). This EUA will remain  in effect (meaning this test can be used) for the duration of the  Covid-19 declaration under Section 564(b)(1) of the Act, 21  U.S.C. section 360bbb-3(b)(1), unless the authorization is  terminated or revoked. Performed at [facility], [address] [place], [place] [address]      Radiological Exams on Admission: CT Code Stroke CTA Head W/WO contrast  Result Date: 03/16/2019 CLINICAL DATA:  Initial evaluation for acute stroke, aphasia. EXAM: CT ANGIOGRAPHY HEAD AND NECK TECHNIQUE: Multidetector CT imaging of the head and neck was performed using the standard protocol during bolus administration of intravenous contrast. Multiplanar CT image reconstructions and MIPs were obtained to evaluate the vascular anatomy. Carotid stenosis measurements (when applicable) are obtained utilizing NASCET criteria, using the distal internal carotid diameter as the denominator. CONTRAST:  OMNIPAQUE IOHEXOL 350 MG/ML SOLN COMPARISON:  Prior CT from earlier same day. FINDINGS: CTA NECK FINDINGS Aortic arch: Visualized arch ectatic measuring up to 3.8 cm in diameter. Mild to moderate plaque within the arch itself. No hemodynamically significant stenosis seen about the origin of the great vessels. Visualized subclavian arteries patent. Right carotid system:  Right CCA patent from its origin to the bifurcation without stenosis. Scattered calcified plaque about the right bifurcation without hemodynamically significant stenosis. Right ICA tortuous proximally but widely patent to the skull base without stenosis, dissection or occlusion. Left carotid system: Left CCA widely patent from its origin to the bifurcation without stenosis. Scattered mixed plaque about the left bifurcation/proximal left ICA without hemodynamically significant stenosis. Left ICA mildly tortuous but widely patent distally to the skull base without stenosis, dissection, or occlusion. Vertebral arteries: Both vertebral arteries arise from the subclavian arteries. Atheromatous plaque at the origin of the left vertebral artery with secondary mild approximate 30% stenosis. Vertebral arteries otherwise widely patent within the neck without stenosis, dissection or occlusion. Skeleton: No acute osseous abnormality. No discrete lytic or blastic osseous lesions. Other neck: No other acute  soft tissue abnormality within the neck. No adenopathy. Subcentimeter hypodense nodule noted within the left lobe of thyroid, for which no follow-up imaging is recommended. Upper chest: Visualized upper chest demonstrates no acute finding. Review of the MIP images confirms the above findings CTA HEAD FINDINGS Anterior circulation: Petrous segments patent bilaterally. Scattered atheromatous plaque within the cavernous/supraclinoid ICAs with associated mild to moderate diffuse narrowing. A1 segments patent bilaterally. Right A1 hypoplastic. Normal anterior communicating artery. Both ACAs demonstrate moderate to advanced diffuse atherosclerotic irregularity but are patent to their distal aspects without high-grade flow-limiting stenosis. Right ACA diffusely hypoplastic. No M1 stenosis or occlusion. Normal MCA bifurcations. Distal MCA branches well perfused and symmetric, although demonstrate extensive small vessel atheromatous  irregularity. Posterior circulation: Vertebral arteries patent to the vertebrobasilar junction without high-grade stenosis. Posterior inferior cerebral arteries patent bilaterally. Short-segment moderate approximate 50% stenosis noted at the proximal basilar artery (series 6, image 126). Basilar otherwise patent to its distal aspect without stenosis. Superior cerebral arteries patent bilaterally. Right PCA supplied via the basilar. Fetal type origin of the left PCA. Extensive atheromatous change throughout both posterior cerebral arteries, which remain perfused to their distal aspects without high-grade stenosis. Venous sinuses: Grossly patent allowing for timing the contrast bolus. Anatomic variants: Fetal type origin of the left PCA. Previously identified small subdural hemorrhage overlying the left cerebral convexity again seen, not significantly changed measuring up to 3 mm in maximal diameter. Review of the MIP images confirms the above findings IMPRESSION: 1. Negative CTA for emergent large vessel occlusion. 2. Extensive atheromatous irregularity throughout the intracranial circulation without proximal high-grade or correctable stenosis. 3. Mild-to-moderate atherosclerotic change about the carotid bifurcations without hemodynamically significant stenosis. 4. Diffuse tortuosity of the major arterial vasculature of the head and neck, suggesting chronic underlying hypertension. Critical Value/emergent results were called by telephone at the time of interpretation on 03/16/2019 at 10:09 pm to [facility] , who verbally acknowledged these results. Electronically Signed   By: Jeannine Boga M.D.   On: 03/16/2019 22:51   CT Code Stroke CTA Neck W/WO contrast  Result Date: 03/16/2019 CLINICAL DATA:  Initial evaluation for acute stroke, aphasia. EXAM: CT ANGIOGRAPHY HEAD AND NECK TECHNIQUE: Multidetector CT imaging of the head and neck was performed using the standard protocol during bolus  administration of intravenous contrast. Multiplanar CT image reconstructions and MIPs were obtained to evaluate the vascular anatomy. Carotid stenosis measurements (when applicable) are obtained utilizing NASCET criteria, using the distal internal carotid diameter as the denominator. CONTRAST:  171mL OMNIPAQUE IOHEXOL 350 MG/ML SOLN COMPARISON:  Prior CT from earlier same day. FINDINGS: CTA NECK FINDINGS Aortic arch: Visualized arch ectatic measuring up to 3.8 cm in diameter. Mild to moderate plaque within the arch itself. No hemodynamically significant stenosis seen about the origin of the great vessels. Visualized subclavian arteries patent. Right carotid system: Right CCA patent from its origin to the bifurcation without stenosis. Scattered calcified plaque about the right bifurcation without hemodynamically significant stenosis. Right ICA tortuous proximally but widely patent to the skull base without stenosis, dissection or occlusion. Left carotid system: Left CCA widely patent from its origin to the bifurcation without stenosis. Scattered mixed plaque about the left bifurcation/proximal left ICA without hemodynamically significant stenosis. Left ICA mildly tortuous but widely patent distally to the skull base without stenosis, dissection, or occlusion. Vertebral arteries: Both vertebral arteries arise from the subclavian arteries. Atheromatous plaque at the origin of the left vertebral artery with secondary mild approximate 30% stenosis. Vertebral arteries otherwise widely patent  within the neck without stenosis, dissection or occlusion. Skeleton: No acute osseous abnormality. No discrete lytic or blastic osseous lesions. Other neck: No other acute soft tissue abnormality within the neck. No adenopathy. Subcentimeter hypodense nodule noted within the left lobe of thyroid, for which no follow-up imaging is recommended. Upper chest: Visualized upper chest demonstrates no acute finding. Review of the MIP images  confirms the above findings CTA HEAD FINDINGS Anterior circulation: Petrous segments patent bilaterally. Scattered atheromatous plaque within the cavernous/supraclinoid ICAs with associated mild to moderate diffuse narrowing. A1 segments patent bilaterally. Right A1 hypoplastic. Normal anterior communicating artery. Both ACAs demonstrate moderate to advanced diffuse atherosclerotic irregularity but are patent to their distal aspects without high-grade flow-limiting stenosis. Right ACA diffusely hypoplastic. No M1 stenosis or occlusion. Normal MCA bifurcations. Distal MCA branches well perfused and symmetric, although demonstrate extensive small vessel atheromatous irregularity. Posterior circulation: Vertebral arteries patent to the vertebrobasilar junction without high-grade stenosis. Posterior inferior cerebral arteries patent bilaterally. Short-segment moderate approximate 50% stenosis noted at the proximal basilar artery (series 6, image 126). Basilar otherwise patent to its distal aspect without stenosis. Superior cerebral arteries patent bilaterally. Right PCA supplied via the basilar. Fetal type origin of the left PCA. Extensive atheromatous change throughout both posterior cerebral arteries, which remain perfused to their distal aspects without high-grade stenosis. Venous sinuses: Grossly patent allowing for timing the contrast bolus. Anatomic variants: Fetal type origin of the left PCA. Previously identified small subdural hemorrhage overlying the left cerebral convexity again seen, not significantly changed measuring up to 3 mm in maximal diameter. Review of the MIP images confirms the above findings IMPRESSION: 1. Negative CTA for emergent large vessel occlusion. 2. Extensive atheromatous irregularity throughout the intracranial circulation without proximal high-grade or correctable stenosis. 3. Mild-to-moderate atherosclerotic change about the carotid bifurcations without hemodynamically significant  stenosis. 4. Diffuse tortuosity of the major arterial vasculature of the head and neck, suggesting chronic underlying hypertension. Critical Value/emergent results were called by telephone at the time of interpretation on 03/16/2019 at 10:09 pm to [facility]providerERIC LINDZEN , who verbally acknowledged these results. Electronically Signed   By: Rise MuBenjamin  McClintock M.D.   On: 03/16/2019 22:51   CT HEAD CODE STROKE WO CONTRAST  Result Date: 03/16/2019 CLINICAL DATA:  Code stroke. Acute presentation with aphasia. Last known normal 1710 hours EXAM: CT HEAD WITHOUT CONTRAST TECHNIQUE: Contiguous axial images were obtained from the base of the skull through the vertex without intravenous contrast. COMPARISON:  None. FINDINGS: Brain: No sign of acute infarction. Mild generalized age related volume loss. No focal abnormality seen affecting the brainstem. 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 Old lacunar infarctions in the right basal ganglia. Minimal small vessel change of the white matter elsewhere. No mass lesion. The patient has a thin subdural hematoma on the left, maximal thickness 3 mm. No mass-effect upon the brain. Vascular: There is atherosclerotic calcification of the major vessels at the base of the brain. Skull: Negative Sinuses/Orbits: Clear/normal Other: None ASPECTS (Alberta Stroke Program Early CT Score) - Ganglionic level infarction (caudate, lentiform nuclei, internal capsule, insula, M1-M3 cortex): 7 - Supraganglionic infarction (M4-M6 cortex): 3 Total score (0-10 with 10 being normal): 10 IMPRESSION: 1. No sign of acute infarction. 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 2. Thin subdural hematoma on the left, maximal thickness 3 mm. No apparent mass effect. 3. ASPECTS is 10 4. These results were called by telephone at the time of interpretation on 03/16/2019  at 7:02 pm to provider [facility] , who verbally acknowledged these  results. Electronically Signed   By: Paulina Fusi M.D.   On: 03/16/2019 19:04    EKG: Independently reviewed.  Normal sinus rhythm.  Assessment/Plan Principal Problem:   Aphasia Active Problems:   ARF (acute renal failure) (HCC)   Essential hypertension   HLD (hyperlipidemia)    1. Brief episode of aphasia for which MRI brain was ordered as recommended by neurologist.  If positive for stroke and further work-up otherwise physical therapy consult.  EEG. 2. Traumatic small subdural hematoma on the left side for which neurology recommended CT head in 1 week and if stable to restart aspirin since patient CT angiogram shows intracranial atherosclerosis. 3. Hypertension on labetalol with permissive hypertension for now until stroke ruled out. 4. Hyperlipidemia on statins. 5. Acute renal failure likely from recent use of NSAIDs.  Gently hydrate and follow metabolic panel.   DVT prophylaxis: SCDs since patient has subdural hematoma. Code Status: Full code. Family Communication: Unable to reach patient's son. Disposition Plan: To be determined. Consults called: Neurology. Admission status: Observation.   Eduard Clos MD [facility] Pager [phone].  If 7PM-7AM, please contact night-coverage [email or web address] Password [email or web address]  03/17/2019, 1:53 AM

## 2019-03-17 NOTE — Progress Notes (Signed)
STROKE TEAM PROGRESS NOTE   INTERVAL HISTORY Pt lying in bed, having EEG. She is awake alert and stated that for the last 2-3 months, she had several episodes of feeling her body leaving her, and hot flashes from her abdomen going up to her face, lasting only seconds, less than one min. She usually can tell when the feeling is coming. It was short or she usually hold onto things so she did not fall. However, she had two falls with the episodes last week including yesterday. She stated that yesterday she was standing and the same feeling comes. She was trying to hold thing but not able, the next thing she knew was when she woke up and her son was at her side. She could not remember how long she had been down. She had slight sore below her right eye this morning.   Vitals:   03/16/19 2315 03/16/19 2339 03/17/19 0038 03/17/19 0407  BP: 133/69 118/64 (!) 148/81 (!) 154/72  Pulse: 70 96 86 80  Resp: (!) 25 20 20 19   Temp:   98.2 F (36.8 C) 97.8 F (36.6 C)  TempSrc:   Oral Oral  SpO2: 95% 97% 96% 95%  Weight:   76.2 kg   Height:   5\' 4"  (1.626 m)     CBC:  Recent Labs  Lab 03/16/19 1851 03/16/19 1852  WBC 11.6*  --   NEUTROABS 9.4*  --   HGB 12.2 12.9  HCT 37.1 38.0  MCV 89.0  --   PLT 276  --     Basic Metabolic Panel:  Recent Labs  Lab 03/16/19 1851 03/16/19 1852  NA 133* 134*  K 4.3 4.3  CL 99 99  CO2 20*  --   GLUCOSE 127* 124*  BUN 19 19  CREATININE 1.23* 1.20*  CALCIUM 9.1  --    Lipid Panel:     Component Value Date/Time   CHOL 205 (H) 03/17/2019 0834   TRIG 107 03/17/2019 0834   HDL 45 03/17/2019 0834   CHOLHDL 4.6 03/17/2019 0834   VLDL 21 03/17/2019 0834   LDLCALC 139 (H) 03/17/2019 0834   HgbA1c:  Lab Results  Component Value Date   HGBA1C 5.3 03/17/2019   Urine Drug Screen: No results found for: LABOPIA, COCAINSCRNUR, LABBENZ, AMPHETMU, THCU, LABBARB  Alcohol Level No results found for: ETH  IMAGING past 48 hours CT Code Stroke CTA Head W/WO  contrast  Result Date: 03/16/2019 CLINICAL DATA:  Initial evaluation for acute stroke, aphasia. EXAM: CT ANGIOGRAPHY HEAD AND NECK TECHNIQUE: Multidetector CT imaging of the head and neck was performed using the standard protocol during bolus administration of intravenous contrast. Multiplanar CT image reconstructions and MIPs were obtained to evaluate the vascular anatomy. Carotid stenosis measurements (when applicable) are obtained utilizing NASCET criteria, using the distal internal carotid diameter as the denominator. CONTRAST:  128mL OMNIPAQUE IOHEXOL 350 MG/ML SOLN COMPARISON:  Prior CT from earlier same day. FINDINGS: CTA NECK FINDINGS Aortic arch: Visualized arch ectatic measuring up to 3.8 cm in diameter. Mild to moderate plaque within the arch itself. No hemodynamically significant stenosis seen about the origin of the great vessels. Visualized subclavian arteries patent. Right carotid system: Right CCA patent from its origin to the bifurcation without stenosis. Scattered calcified plaque about the right bifurcation without hemodynamically significant stenosis. Right ICA tortuous proximally but widely patent to the skull base without stenosis, dissection or occlusion. Left carotid system: Left CCA widely patent from its origin to the bifurcation without  stenosis. Scattered mixed plaque about the left bifurcation/proximal left ICA without hemodynamically significant stenosis. Left ICA mildly tortuous but widely patent distally to the skull base without stenosis, dissection, or occlusion. Vertebral arteries: Both vertebral arteries arise from the subclavian arteries. Atheromatous plaque at the origin of the left vertebral artery with secondary mild approximate 30% stenosis. Vertebral arteries otherwise widely patent within the neck without stenosis, dissection or occlusion. Skeleton: No acute osseous abnormality. No discrete lytic or blastic osseous lesions. Other neck: No other acute soft tissue  abnormality within the neck. No adenopathy. Subcentimeter hypodense nodule noted within the left lobe of thyroid, for which no follow-up imaging is recommended. Upper chest: Visualized upper chest demonstrates no acute finding. Review of the MIP images confirms the above findings CTA HEAD FINDINGS Anterior circulation: Petrous segments patent bilaterally. Scattered atheromatous plaque within the cavernous/supraclinoid ICAs with associated mild to moderate diffuse narrowing. A1 segments patent bilaterally. Right A1 hypoplastic. Normal anterior communicating artery. Both ACAs demonstrate moderate to advanced diffuse atherosclerotic irregularity but are patent to their distal aspects without high-grade flow-limiting stenosis. Right ACA diffusely hypoplastic. No M1 stenosis or occlusion. Normal MCA bifurcations. Distal MCA branches well perfused and symmetric, although demonstrate extensive small vessel atheromatous irregularity. Posterior circulation: Vertebral arteries patent to the vertebrobasilar junction without high-grade stenosis. Posterior inferior cerebral arteries patent bilaterally. Short-segment moderate approximate 50% stenosis noted at the proximal basilar artery (series 6, image 126). Basilar otherwise patent to its distal aspect without stenosis. Superior cerebral arteries patent bilaterally. Right PCA supplied via the basilar. Fetal type origin of the left PCA. Extensive atheromatous change throughout both posterior cerebral arteries, which remain perfused to their distal aspects without high-grade stenosis. Venous sinuses: Grossly patent allowing for timing the contrast bolus. Anatomic variants: Fetal type origin of the left PCA. Previously identified small subdural hemorrhage overlying the left cerebral convexity again seen, not significantly changed measuring up to 3 mm in maximal diameter. Review of the MIP images confirms the above findings IMPRESSION: 1. Negative CTA for emergent large vessel  occlusion. 2. Extensive atheromatous irregularity throughout the intracranial circulation without proximal high-grade or correctable stenosis. 3. Mild-to-moderate atherosclerotic change about the carotid bifurcations without hemodynamically significant stenosis. 4. Diffuse tortuosity of the major arterial vasculature of the head and neck, suggesting chronic underlying hypertension. Critical Value/emergent results were called by telephone at the time of interpretation on 03/16/2019 at 10:09 pm to [facility] , who verbally acknowledged these results. Electronically Signed   By: Rise MuBenjamin  McClintock M.D.   On: 03/16/2019 22:51   CT Code Stroke CTA Neck W/WO contrast  Result Date: 03/16/2019 CLINICAL DATA:  Initial evaluation for acute stroke, aphasia. EXAM: CT ANGIOGRAPHY HEAD AND NECK TECHNIQUE: Multidetector CT imaging of the head and neck was performed using the standard protocol during bolus administration of intravenous contrast. Multiplanar CT image reconstructions and MIPs were obtained to evaluate the vascular anatomy. Carotid stenosis measurements (when applicable) are obtained utilizing NASCET criteria, using the distal internal carotid diameter as the denominator. CONTRAST:  100mL OMNIPAQUE IOHEXOL 350 MG/ML SOLN COMPARISON:  Prior CT from earlier same day. FINDINGS: CTA NECK FINDINGS Aortic arch: Visualized arch ectatic measuring up to 3.8 cm in diameter. Mild to moderate plaque within the arch itself. No hemodynamically significant stenosis seen about the origin of the great vessels. Visualized subclavian arteries patent. Right carotid system: Right CCA patent from its origin to the bifurcation without stenosis. Scattered calcified plaque about the right bifurcation without hemodynamically significant stenosis. Right ICA tortuous proximally but  widely patent to the skull base without stenosis, dissection or occlusion. Left carotid system: Left CCA widely patent from its origin to the  bifurcation without stenosis. Scattered mixed plaque about the left bifurcation/proximal left ICA without hemodynamically significant stenosis. Left ICA mildly tortuous but widely patent distally to the skull base without stenosis, dissection, or occlusion. Vertebral arteries: Both vertebral arteries arise from the subclavian arteries. Atheromatous plaque at the origin of the left vertebral artery with secondary mild approximate 30% stenosis. Vertebral arteries otherwise widely patent within the neck without stenosis, dissection or occlusion. Skeleton: No acute osseous abnormality. No discrete lytic or blastic osseous lesions. Other neck: No other acute soft tissue abnormality within the neck. No adenopathy. Subcentimeter hypodense nodule noted within the left lobe of thyroid, for which no follow-up imaging is recommended. Upper chest: Visualized upper chest demonstrates no acute finding. Review of the MIP images confirms the above findings CTA HEAD FINDINGS Anterior circulation: Petrous segments patent bilaterally. Scattered atheromatous plaque within the cavernous/supraclinoid ICAs with associated mild to moderate diffuse narrowing. A1 segments patent bilaterally. Right A1 hypoplastic. Normal anterior communicating artery. Both ACAs demonstrate moderate to advanced diffuse atherosclerotic irregularity but are patent to their distal aspects without high-grade flow-limiting stenosis. Right ACA diffusely hypoplastic. No M1 stenosis or occlusion. Normal MCA bifurcations. Distal MCA branches well perfused and symmetric, although demonstrate extensive small vessel atheromatous irregularity. Posterior circulation: Vertebral arteries patent to the vertebrobasilar junction without high-grade stenosis. Posterior inferior cerebral arteries patent bilaterally. Short-segment moderate approximate 50% stenosis noted at the proximal basilar artery (series 6, image 126). Basilar otherwise patent to its distal aspect without  stenosis. Superior cerebral arteries patent bilaterally. Right PCA supplied via the basilar. Fetal type origin of the left PCA. Extensive atheromatous change throughout both posterior cerebral arteries, which remain perfused to their distal aspects without high-grade stenosis. Venous sinuses: Grossly patent allowing for timing the contrast bolus. Anatomic variants: Fetal type origin of the left PCA. Previously identified small subdural hemorrhage overlying the left cerebral convexity again seen, not significantly changed measuring up to 3 mm in maximal diameter. Review of the MIP images confirms the above findings IMPRESSION: 1. Negative CTA for emergent large vessel occlusion. 2. Extensive atheromatous irregularity throughout the intracranial circulation without proximal high-grade or correctable stenosis. 3. Mild-to-moderate atherosclerotic change about the carotid bifurcations without hemodynamically significant stenosis. 4. Diffuse tortuosity of the major arterial vasculature of the head and neck, suggesting chronic underlying hypertension. Critical Value/emergent results were called by telephone at the time of interpretation on 03/16/2019 at 10:09 pm to [facility] , who verbally acknowledged these results. Electronically Signed   By: Rise Mu M.D.   On: 03/16/2019 22:51   MR BRAIN WO CONTRAST  Result Date: 03/17/2019 CLINICAL DATA:  Initial evaluation for acute ataxia, stroke suspected. EXAM: MRI HEAD WITHOUT CONTRAST TECHNIQUE: Multiplanar, multiecho pulse sequences of the brain and surrounding structures were obtained without intravenous contrast. COMPARISON:  Prior CT and CTA from 03/16/2019. FINDINGS: Brain: Generalized age-related cerebral atrophy. Patchy T2/FLAIR hyperintensity within the periventricular deep white matter both cerebral hemispheres most consistent with chronic small vessel ischemic disease, mild in nature. Few scatter remote lacunar infarcts noted involving the  right basal ganglia and bilateral thalami. Few tiny remote bilateral cerebellar infarcts noted. No abnormal foci of restricted diffusion to suggest acute or subacute ischemia. Gray-white matter differentiation maintained. No encephalomalacia to suggest chronic cortical infarction. No mass lesion, midline shift or mass effect. No hydrocephalus. Small acute subdural hematoma again noted overlying the left  cerebral convexity, measuring up to 3-4 mm in maximal thickness. No significant mass effect. This is relatively stable from previous. No other acute intracranial hemorrhage. Pituitary gland suprasellar region normal. Midline structures intact. Vascular: Major intracranial vascular flow voids are maintained. Skull and upper cervical spine: Craniocervical junction within normal limits. Upper cervical spine normal. Bone marrow signal intensity within normal limits. Hyperostosis frontalis interna noted. No scalp soft tissue abnormality. Sinuses/Orbits: Patient status post bilateral ocular lens replacement. Paranasal sinuses are clear. Moderate left mastoid effusion noted. Visualized nasopharynx within normal limits. Other: None. IMPRESSION: 1. No acute intracranial infarct. 2. 3-4 mm subdural hematoma overlying the left cerebral convexity without significant mass effect, stable. 3. Age-related cerebral atrophy with mild chronic small vessel ischemic disease, with a few scattered remote lacunar infarcts involving the right basal ganglia, thalami, and cerebellum. Electronically Signed   By: Rise Mu M.D.   On: 03/17/2019 02:21   ECHOCARDIOGRAM COMPLETE  Result Date: 03/17/2019   ECHOCARDIOGRAM REPORT   Patient Name:   Paula Hanson Date of Exam: 03/17/2019 Medical Rec #:  [id number]      Height:       64.0 in Accession #:    [id number]     Weight:       168.0 lb Date of Birth:  1930-05-02      BSA:          1.82 m Patient Age:    86 years       BP:           154/72 mmHg Patient Gender: F              HR:            86 bpm. Exam Location:  Inpatient Procedure: 2D Echo Indications:    stroke 434.91  History:        Patient has no prior history of Echocardiogram examinations.                 Risk Factors:Hypertension and Dyslipidemia.  Sonographer:    Delcie Roch Referring Phys: [id number] Cleotis Sparr IMPRESSIONS  1. Left ventricular ejection fraction, by visual estimation, is 70 to 75%. The left ventricle has normal function. There is borderline left ventricular hypertrophy.  2. The left ventricle has no regional wall motion abnormalities.  3. Global right ventricle has normal systolic function.The right ventricular size is normal. No increase in right ventricular wall thickness.  4. Left atrial size was normal.  5. Right atrial size was normal.  6. Presence of pericardial fat pad.  7. Mild mitral annular calcification.  8. The mitral valve is grossly normal. Trivial mitral valve regurgitation.  9. The tricuspid valve is grossly normal. 10. The aortic valve is tricuspid. Aortic valve regurgitation is mild. Mild aortic valve stenosis. 11. Aortic valve area, by VTI measures 1.55 cm. 12. Aortic valve mean gradient measures 11.0 mmHg. 13. Pulmonic regurgitation is mild. 14. The pulmonic valve was grossly normal. Pulmonic valve regurgitation is mild. 15. Mildly elevated pulmonary artery systolic pressure. 16. The tricuspid regurgitant velocity is 2.99 m/s, and with an assumed right atrial pressure of 3 mmHg, the estimated right ventricular systolic pressure is mildly elevated at 38.8 mmHg. 17. The inferior vena cava is normal in size with greater than 50% respiratory variability, suggesting right atrial pressure of 3 mmHg. FINDINGS  Left Ventricle: Left ventricular ejection fraction, by visual estimation, is 70 to 75%. The left ventricle has normal function. The left ventricle has no regional wall  motion abnormalities. The left ventricular internal cavity size was the left ventricle is normal in size. There is borderline left  ventricular hypertrophy. Right Ventricle: The right ventricular size is normal. No increase in right ventricular wall thickness. Global RV systolic function is has normal systolic function. The tricuspid regurgitant velocity is 2.99 m/s, and with an assumed right atrial pressure  of 3 mmHg, the estimated right ventricular systolic pressure is mildly elevated at 38.8 mmHg. Left Atrium: Left atrial size was normal in size. Right Atrium: Right atrial size was normal in size Pericardium: There is no evidence of pericardial effusion. Presence of pericardial fat pad. Mitral Valve: The mitral valve is grossly normal. Mild mitral annular calcification. Trivial mitral valve regurgitation. Tricuspid Valve: The tricuspid valve is grossly normal. Tricuspid valve regurgitation is trivial. Aortic Valve: The aortic valve is tricuspid. Aortic valve regurgitation is mild. Aortic regurgitation PHT measures 307 msec. Mild aortic stenosis is present. Mild to moderate aortic valve annular calcification. Aortic valve mean gradient measures 11.0 mmHg. Aortic valve peak gradient measures 18.8 mmHg. Aortic valve area, by VTI measures 1.55 cm. Pulmonic Valve: The pulmonic valve was grossly normal. Pulmonic valve regurgitation is mild. Pulmonic regurgitation is mild. Aorta: The aortic root is normal in size and structure. Venous: The inferior vena cava is normal in size with greater than 50% respiratory variability, suggesting right atrial pressure of 3 mmHg. IAS/Shunts: No atrial level shunt detected by color flow Doppler.  LEFT VENTRICLE PLAX 2D LVIDd:         4.10 cm  Diastology LVIDs:         2.70 cm  LV e' lateral:   9.14 cm/s LV PW:         0.90 cm  LV E/e' lateral: 7.5 LV IVS:        1.00 cm  LV e' medial:    6.31 cm/s LVOT diam:     1.70 cm  LV E/e' medial:  10.8 LV SV:         47 ml LV SV Index:   25.14 LVOT Area:     2.27 cm  RIGHT VENTRICLE RV S prime:     15.70 cm/s LEFT ATRIUM             Index       RIGHT ATRIUM          Index  LA diam:        3.00 cm 1.65 cm/m  RA Area:     9.11 cm LA Vol (A2C):   29.2 ml 16.07 ml/m RA Volume:   15.50 ml 8.53 ml/m LA Vol (A4C):   33.8 ml 18.61 ml/m LA Biplane Vol: 32.0 ml 17.62 ml/m  AORTIC VALVE AV Area (Vmax):    1.58 cm AV Area (Vmean):   1.41 cm AV Area (VTI):     1.55 cm AV Vmax:           217.00 cm/s AV Vmean:          156.000 cm/s AV VTI:            0.394 m AV Peak Grad:      18.8 mmHg AV Mean Grad:      11.0 mmHg LVOT Vmax:         151.00 cm/s LVOT Vmean:        96.700 cm/s LVOT VTI:          0.269 m LVOT/AV VTI ratio: 0.68 AI PHT:  307 msec  AORTA Ao Root diam: 3.10 cm MITRAL VALVE                         TRICUSPID VALVE MV Area (PHT): 2.99 cm              TR Peak grad:   35.8 mmHg MV PHT:        73.66 msec            TR Vmax:        299.00 cm/s MV Decel Time: 254 msec MV E velocity: 68.10 cm/s  103 cm/s  SHUNTS MV A velocity: 103.00 cm/s 70.3 cm/s Systemic VTI:  0.27 m MV E/A ratio:  0.66        1.5       Systemic Diam: 1.70 cm  Nona Dell MD Electronically signed by Nona Dell MD Signature Date/Time: 03/17/2019/11:26:09 AM    Final    CT HEAD CODE STROKE WO CONTRAST  Result Date: 03/16/2019 CLINICAL DATA:  Code stroke. Acute presentation with aphasia. Last known normal 1710 hours EXAM: CT HEAD WITHOUT CONTRAST TECHNIQUE: Contiguous axial images were obtained from the base of the skull through the vertex without intravenous contrast. COMPARISON:  None. FINDINGS: Brain: No sign of acute infarction. Mild generalized age related volume loss. No focal abnormality seen affecting the brainstem. 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 Old lacunar infarctions in the right basal ganglia. Minimal small vessel change of the white matter elsewhere. No mass lesion. The patient has a thin subdural hematoma on the left, maximal thickness 3 mm. No mass-effect upon the brain. Vascular: There is atherosclerotic calcification of the major vessels at the base of the  brain. Skull: Negative Sinuses/Orbits: Clear/normal Other: None ASPECTS (Alberta Stroke Program Early CT Score) - Ganglionic level infarction (caudate, lentiform nuclei, internal capsule, insula, M1-M3 cortex): 7 - Supraganglionic infarction (M4-M6 cortex): 3 Total score (0-10 with 10 being normal): 10 IMPRESSION: 1. No sign of acute infarction. 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 2. Thin subdural hematoma on the left, maximal thickness 3 mm. No apparent mass effect. 3. ASPECTS is 10 4. These results were called by telephone at the time of interpretation on 03/16/2019 at 7:02 pm to provider [facility] , who verbally acknowledged these results. Electronically Signed   By: Paulina Fusi M.D.   On: 03/16/2019 19:04    PHYSICAL EXAM  Temp:  [97.6 F (36.4 C)-99 F (37.2 C)] 98.3 F (36.8 C) (01/01 1220) Pulse Rate:  [67-96] 67 (01/01 1220) Resp:  [13-25] 15 (01/01 1220) BP: (118-172)/(60-89) 165/72 (01/01 1220) SpO2:  [95 %-99 %] 96 % (01/01 1220) Weight:  [76.2 kg] 76.2 kg (01/01 0038)  General - Well nourished, well developed, in no apparent distress, mild ecchymosis right eye lower periorbital region.  Ophthalmologic - fundi not visualized due to noncooperation.  Cardiovascular - Regular rhythm and rate.  Mental Status -  Level of arousal and orientation to time, place, and person were intact. Language including expression, naming, repetition, comprehension was assessed and found intact. Fund of Knowledge was assessed and was intact.  Cranial Nerves II - XII - II - Visual field intact OU. III, IV, VI - Extraocular movements intact. V - Facial sensation intact bilaterally. VII - Facial movement intact bilaterally. VIII - Hard of hearing & vestibular intact bilaterally. X - Palate elevates symmetrically. XI - Chin turning & shoulder shrug  intact bilaterally. XII - Tongue protrusion intact.  Motor Strength - The  patient's strength was normal in all extremities and pronator drift was absent except left LE knee flexion 4/5 due to knee pain, chronic.  Bulk was normal and fasciculations were absent.   Motor Tone - Muscle tone was assessed at the neck and appendages and was normal.  Reflexes - The patient's reflexes were symmetrical in all extremities and she had no pathological reflexes.  Sensory - Light touch, temperature/pinprick were assessed and were symmetrical.    Coordination - The patient had normal movements in the hands with no ataxia or dysmetria.  Tremor was absent.  Gait and Station - deferred.   ASSESSMENT/PLAN Ms. Angus SellerLaura E Hanson is a 86 y.o. female with history of HLD and GERD [facility] with transient dysphasia after a fall w/ R periorbital bruising.    Traumatic small L SDH following fall - Seizure activity vs encephalopathy post syncope  Transient dysphasia after fall  Code Stroke CT head No acute abnormality. Small vessel disease. Atrophy. Old L cerebellar, R basal ganglia and white matter lacunes. Thin L SDH. ASPECTS 10.     CTA head & neck no LVO. Extensive intracranial atherosclerosis. Mild to moderate ICA atherosclerosis at bifurcation. Tortuosity major vessels head aneurysm neck  MRI  No acute infarct. L thin layer SDH. Small vessel disease. Atrophy. Old R basal ganglia, thalamic and cerebellar lacunes.  2D Echo EF 70-75%. No source of embolus   EEG Intermittent slow, left frontotemporal - no Sz  LDL 139  HgbA1c 5.3  SCDs for VTE prophylaxis  No antithrombotic prior to admission, now on No antithrombotic given SDH.   No AEDs needed at this time, but recommend close monitoring and seizure precautions  Therapy recommendations: HH PT  Disposition:  Return home Stroke Service will sign off. Please call should any needs arise.  Orthostatic hypotension   Episodes of "feelings body leaving out from her" for the last 2-3 months  Can not  remember whether they were position related  Transient and pt usually can hold onto things to avoid fall  Had two falls last week with the one yesterday with LOC  Orthostatic vital positive for orthostatic hypotension - sitting BP 158/86, standing 128/74 and pt felt lightheaded with feeling of floating out of her body, needed to sit down, and recheck sitting BP 157/79.   Most likely the cause of her recent falls  Management as per primary team  Close PCP and cardiology follow up  Hypertension  Stable . BP goal normotensive . BP goal < 160  Hyperlipidemia  Home meds:  lipitor 40 and fish oil, resumed in hospital  LDL 139, goal < 100  Continue statin and fish oil at discharge  Other Stroke Risk Factors  Advanced age  Overweight, Body mass index is 28.84 kg/m., recommend weight loss, diet and exercise as appropriate   Other Active Problems  ARF d/t recent NSAID use   Right breast cancer  Hospital day # 0  Neurology will sign off. Please call with questions. No neuro follow up needed at this time. Thanks for the consult.  Marvel PlanJindong Nyajah Hyson, MD PhD Stroke Neurology 03/17/2019 12:51 PM    To contact Stroke Continuity provider, please refer to [email or web address]. After hours, contact General Neurology

## 2019-03-17 NOTE — Evaluation (Signed)
Occupational Therapy Evaluation Patient Details Name: Paula Hanson MRN: [id number] DOB: Nov 10, 1930 Today's Date: 03/17/2019    History of Present Illness Patient is a 86 y/o female who presents with episode of aphasia s/p fall at home. Tx from [facility] for concern for stroke. Head CT-43mm SDH on left. Brain MRI-unremarkable. NIH:1. PMH includes HTN, HLD, Hypercholesteremia.   Clinical Impression   PT admitted with s/p fall with SDH L . Pt currently with functional limitiations due to the deficits listed below (see OT problem list). Pt currently has no set (A) for home and lacks awareness to have (A) for discharge. Pt with multiple falls prior to admission. HR 122 with symptoms with onset of unsteadiness on her feet. Pt just reports "I feel poorly" Pt return to supine with elevated BP this session. Pt noted to have orthostatic BP with PT evaluation this AM.  Pt will benefit from skilled OT to increase their independence and safety with adls and balance to allow discharge SNF.Family will have to be able to provide 24/7 (A) for patient to d/c home safely.      Follow Up Recommendations  SNF    Equipment Recommendations  3 in 1 bedside commode    Recommendations for Other Services       Precautions / Restrictions Precautions Precautions: Fall Precaution Comments: BP 202/76 and patient c/o of feeling poorly Restrictions Weight Bearing Restrictions: No      Mobility Bed Mobility Overal bed mobility: Needs Assistance Bed Mobility: Supine to Sit;Sit to Supine     Supine to sit: Min guard;HOB elevated Sit to supine: Min guard   General bed mobility comments: no use of bed rail, able to progress to eob  Transfers Overall transfer level: Needs assistance Equipment used: None Transfers: Sit to/from Stand Sit to Stand: Min assist         General transfer comment: pt requires stedy (A)    Balance Overall balance assessment: Needs assistance;History of Falls Sitting-balance  support: Feet supported;No upper extremity supported Sitting balance-Leahy Scale: Good     Standing balance support: During functional activity;Single extremity supported Standing balance-Leahy Scale: Poor Standing balance comment: Requires Ue support in standing and more assist when symptomatic from BP drop.                           ADL either performed or assessed with clinical judgement   ADL Overall ADL's : Needs assistance/impaired Eating/Feeding: Modified independent   Grooming: Wash/dry hands;Wash/dry face;Min guard;Standing   Upper Body Bathing: Minimal assistance;Sitting   Lower Body Bathing: Minimal assistance;Sit to/from stand   Upper Body Dressing : Minimal assistance   Lower Body Dressing: Minimal assistance;Sit to/from stand   Toilet Transfer: Minimal assistance;RW;BSC   Toileting- Architect and Hygiene: Min guard;Sitting/lateral lean       Functional mobility during ADLs: Minimal assistance;Rolling walker General ADL Comments: pt closing eyes at times holding head. pt told BP and states "well i guess i should take these" pt grabs two pills and swallows them with water in cup. RN notified of patient taking medication during session      Vision         Perception     Praxis      Pertinent Vitals/Pain Pain Assessment: Faces Faces Pain Scale: Hurts even more Pain Location: generalized unable to verbalize. just states "i feel worse"     Hand Dominance Right   Extremity/Trunk Assessment Upper Extremity Assessment Upper Extremity Assessment:  Overall [facility] for tasks assessed   Lower Extremity Assessment Lower Extremity Assessment: Defer to PT evaluation   Cervical / Trunk Assessment Cervical / Trunk Assessment: Kyphotic   Communication Communication Communication: Expressive difficulties   Cognition Arousal/Alertness: Awake/alert Behavior During Therapy: WFL for tasks assessed/performed Overall Cognitive Status: No  family/caregiver present to determine baseline cognitive functioning                                 General Comments: Seems to be able to state course of events at ths hospital, [facility]. Concerned about pt's safety awareness/awareness of deficits..   General Comments  pt exiting bed on arrival to go to bathroom. Ot unable to establish baseline . HR 122 with transfer. BP upon return to supine 202/76    Exercises     Shoulder Instructions      Home Living Family/patient expects to be discharged to:: Private residence Living Arrangements: Alone Available Help at Discharge: Family;Available PRN/intermittently Type of Home: House Home Access: Level entry     Home Layout: One level;Laundry or work area in basement     [employer]: Producer, television/film/video: Standard     Home Equipment: None   Additional Comments: 1 son is returning to [place] and the other son works full time. pt states she has two sisters that are both in 63s and can no help her. pt does not have a set discharge (A) per this session      Prior Functioning/Environment Level of Independence: Independent        Comments: Does own ADLs, drives. Does some IADLs. Reports 2 falls- one she passed out and the other was mechanical.        OT Problem List: Decreased strength;Decreased activity tolerance;Impaired balance (sitting and/or standing);Decreased cognition;Decreased safety awareness;Decreased knowledge of use of DME or AE;Obesity;Pain      OT Treatment/Interventions: Self-care/ADL training;Therapeutic exercise;Neuromuscular education;Energy conservation;DME and/or AE instruction;Manual therapy;Modalities;Therapeutic activities;Cognitive remediation/compensation;Patient/family education;Balance training    OT Goals(Current goals can be found in the care plan section) Acute Rehab OT Goals Patient Stated Goal: to get better and go home OT Goal Formulation: Patient unable to participate in  goal setting Time For Goal Achievement: 2019/04/27 Potential to Achieve Goals: Good  OT Frequency: Min 3X/week   Barriers to D/C: Decreased caregiver support  lives alone and unable to states WHOM will help       Co-evaluation              AM-PAC OT "6 Clicks" Daily Activity     Outcome Measure Help from another person eating meals?: A Little Help from another person taking care of personal grooming?: A Little Help from another person toileting, which includes using toliet, bedpan, or urinal?: A Lot Help from another person bathing (including washing, rinsing, drying)?: A Lot Help from another person to put on and taking off regular upper body clothing?: A Little Help from another person to put on and taking off regular lower body clothing?: A Lot 6 Click Score: 15   End of Session Equipment Utilized During Treatment: Gait belt;Rolling walker Nurse Communication: Mobility status;Precautions  Activity Tolerance: Patient limited by fatigue;Patient limited by pain Patient left: in bed;with call bell/phone within reach;with bed alarm set  OT Visit Diagnosis: Unsteadiness on feet (R26.81);Muscle weakness (generalized) (M62.81)                Time: 2694-8546 OT Time Calculation (min):  12 min Charges:  OT General Charges $OT Visit: 1 Visit OT Evaluation $OT Eval Moderate Complexity: 1 Mod   Brynn, OTR/L  Acute Rehabilitation Services Pager: [phone] Office: [phone] .   Jeri Modena 03/17/2019, 3:43 PM

## 2019-03-17 NOTE — Progress Notes (Signed)
  Echocardiogram 2D Echocardiogram has been performed.  Delcie Roch 03/17/2019, 10:51 AM

## 2019-03-17 NOTE — Progress Notes (Signed)
SLP Cancellation Note  Patient Details Name: Paula Hanson MRN: [id number] DOB: 1930-11-27   Cancelled treatment:       Reason Eval/Treat Not Completed: Patient at procedure or test/unavailable; attempted x2; pt having EEG completed; ST will f/u as able to complete SLE.   Tressie Stalker, M.S., CCC-SLP 03/17/2019, 12:07 PM

## 2019-03-18 LAB — URINALYSIS, ROUTINE W REFLEX MICROSCOPIC
Bilirubin Urine: NEGATIVE
Glucose, UA: NEGATIVE mg/dL
Hgb urine dipstick: NEGATIVE
Ketones, ur: NEGATIVE mg/dL
Nitrite: NEGATIVE
Protein, ur: NEGATIVE mg/dL
Specific Gravity, Urine: 1.008 (ref 1.005–1.030)
pH: 6 (ref 5.0–8.0)

## 2019-03-18 LAB — BASIC METABOLIC PANEL
Anion gap: 10 (ref 5–15)
BUN: 20 mg/dL (ref 8–23)
CO2: 21 mmol/L — ABNORMAL LOW (ref 22–32)
Calcium: 9 mg/dL (ref 8.9–10.3)
Chloride: 105 mmol/L (ref 98–111)
Creatinine, Ser: 1.03 mg/dL — ABNORMAL HIGH (ref 0.44–1.00)
GFR calc Af Amer: 57 mL/min — ABNORMAL LOW (ref >60)
GFR calc non Af Amer: 49 mL/min — ABNORMAL LOW (ref >60)
Glucose, Bld: 96 mg/dL (ref 70–99)
Potassium: 3.8 mmol/L (ref 3.5–5.1)
Sodium: 136 mmol/L (ref 135–145)

## 2019-03-18 MED ORDER — ACETAMINOPHEN 325 MG PO TABS: 650.0000 mg | ORAL_TABLET | ORAL | Status: AC | PRN

## 2019-03-18 NOTE — Progress Notes (Signed)
Physical Therapy Treatment Patient Details Name: Paula Hanson MRN: [id number] DOB: Aug 02, 1930 Today's Date: 03/18/2019    History of Present Illness Patient is a 86 y/o female who presents with episode of aphasia s/p fall at home. Tx from [facility] for concern for stroke. Head CT-100mm SDH on left. Brain MRI-unremarkable. NIH:1. PMH includes HTN, HLD, Hypercholesteremia.    PT Comments    Pt tolerated treatment well, demonstrating improved gait tolerance and balance during OOB activity. Pt is able to ambulate with use of Rollator without physical assistance requirements and is able to demonstrate how to properly manage brakes. Pt will benefit from further acute PT services to improve balance and gait quality to further reduce falls risk. Pt will benefit from HHPT, a 4 wheeled walker, and 24/7 supervision from family upon return home. Pt reports her son is able to be there 24/7 temporarily to allow her to transition back to the home setting.   Follow Up Recommendations  Home health PT;Supervision for mobility/OOB;Supervision/Assistance - 24 hour     Equipment Recommendations  Other (comment)(4 wheeled walker, pt would prefer one with storage area)    Recommendations for Other Services       Precautions / Restrictions Precautions Precautions: Fall Restrictions Weight Bearing Restrictions: No    Mobility  Bed Mobility Overal bed mobility: Needs Assistance Bed Mobility: Sit to Supine       Sit to supine: Supervision      Transfers Overall transfer level: Needs assistance Equipment used: Rolling walker (2 wheeled);4-wheeled walker Transfers: Sit to/from Stand Sit to Stand: Supervision         General transfer comment: pt performs one stand with use of RW and 2 with rollator  Ambulation/Gait Ambulation/Gait assistance: Supervision Gait Distance (Feet): 120 Feet(60' with Rollator, 120 with RW) Assistive device: Rolling walker (2 wheeled);4-wheeled walker Gait  Pattern/deviations: Step-to pattern Gait velocity: decreased Gait velocity interpretation: 1.31 - 2.62 ft/sec, indicative of limited community ambulator General Gait Details: pt with steady step to gait with both devices   Stairs             Wheelchair Mobility    Modified Rankin (Stroke Patients Only) Modified Rankin (Stroke Patients Only) Pre-Morbid Rankin Score: Slight disability Modified Rankin: Moderate disability     Balance Overall balance assessment: Needs assistance Sitting-balance support: No upper extremity supported;Feet supported Sitting balance-Leahy Scale: Good Sitting balance - Comments: modI   Standing balance support: Single extremity supported;During functional activity Standing balance-Leahy Scale: Good Standing balance comment: supervision with unilateral UE support of RW or Rollator                            Cognition Arousal/Alertness: Awake/alert Behavior During Therapy: WFL for tasks assessed/performed Overall Cognitive Status: Within Functional Limits for tasks assessed                                        Exercises      General Comments General comments (skin integrity, edema, etc.): VSS      Pertinent Vitals/Pain Pain Assessment: Faces Faces Pain Scale: Hurts little more Pain Location: Head Pain Descriptors / Indicators: Aching Pain Intervention(s): Limited activity within patient's tolerance    Home Living                      Prior Function  PT Goals (current goals can now be found in the care plan section) Acute Rehab PT Goals Patient Stated Goal: to get better and go home Progress towards PT goals: Progressing toward goals    Frequency    Min 3X/week      PT Plan Current plan remains appropriate    Co-evaluation              AM-PAC PT "6 Clicks" Mobility   Outcome Measure  Help needed turning from your back to your side while in a flat bed without  using bedrails?: None Help needed moving from lying on your back to sitting on the side of a flat bed without using bedrails?: None Help needed moving to and from a bed to a chair (including a wheelchair)?: None Help needed standing up from a chair using your arms (e.g., wheelchair or bedside chair)?: None Help needed to walk in hospital room?: None Help needed climbing 3-5 steps with a railing? : A Little 6 Click Score: 23    End of Session Equipment Utilized During Treatment: (none) Activity Tolerance: Patient tolerated treatment well Patient left: in bed;with call bell/phone within reach;with bed alarm set Nurse Communication: Mobility status PT Visit Diagnosis: Dizziness and giddiness (R42);Difficulty in walking, not elsewhere classified (R26.2);Unsteadiness on feet (R26.81)     Time: 4098-1191 PT Time Calculation (min) (ACUTE ONLY): 17 min  Charges:  $Gait Training: 8-22 mins                     Arlyss Gandy, PT, DPT Acute Rehabilitation Pager: [phone]    Arlyss Gandy 03/18/2019, 10:26 AM

## 2019-03-18 NOTE — TOC Transition Note (Addendum)
Transition of Care [facility]) - CM/SW Discharge Note   Patient Details  Name: EUSTACIA URBANEK MRN: [id number] Date of Birth: 02/28/1931  [facility] CM/SW Contact:  Deveron Furlong, RN 03/18/2019, 1:13 PM   Clinical Narrative:    Discussed d/c plan with patient.  Patient's son is available for assistance.  Unable to contact son.    Patient is agreeable to home health PT.  Patient will reside at [address], [place].  Both agencies on [employer] for this zip code, [facility] and [facility], declined referral.  [facility] also services this area but declined as insurance plan is not in network.  Advised patient to inquire with PCP at follow up appointment as the agencies may be able to accept new referrals in the next couple of weeks.  Patient declines outpatient therapy.  Patient requested rollator.  Rollator will be delivered to room prior to d/c.    Final next level of care: Home/Self Care Barriers to Discharge: No Barriers Identified   Patient Goals and CMS Choice   CMS Medicare.gov Compare Post Acute Care list provided to:: Patient Choice offered to / list presented to : Patient   Discharge Plan and Services                DME Arranged: Walker rolling with seat DME Agency: [employer] Date DME Agency Contacted: 03/18/19 Time DME Agency Contacted: 1212 Representative spoke with at DME Agency: Ledell Noss

## 2019-03-18 NOTE — Discharge Summary (Signed)
Physician Discharge Summary  Paula Hanson MGQ:[id number] DOB: 17-Jun-1930 DOA: 03/16/2019  PCP: Toma Deiters, MD  Admit date: 03/16/2019 Discharge date: 03/18/2019  Admitted From: home Discharge disposition: home   Recommendations for Outpatient Follow-Up:   1. Unfortunately patient's insurance is such that home health was not able to be arranged-- she will require 24/7 supervision and is at a high risk for further falls/coming back to the hospital 2. Encouraged hydration  3. Compression hose    Discharge Diagnosis:   Principal Problem:   Aphasia Active Problems:   ARF (acute renal failure) (HCC)   Essential hypertension   HLD (hyperlipidemia)   Orthostatic hypotension    Discharge Condition: Improved.  Diet recommendation: Low sodium, heart healthy  Wound care: None.  Code status: Full.   History of Present Illness:   Paula Hanson is a 86 y.o. female with history of hypertension, hyperlipidemia was brought to the ER at [facility] for brief episode of difficulty talking.  Patient son had just released back from [place] and was checking on his mother when patient had a fall and at that time patient had difficulty talking and was brought to the ER after visiting the primary care.  In the ER patient initially was found to have aphasia unable to talk CT head was showing a subdural hematoma on the left side measuring 3 mm.  Tele neurology was consulted and the code stroke was activated and transferred to [facility].  Per patient's family and patient patient has been having frequent falls 1 was about 2 weeks ago and patient states she also has been examined right-sided temporal headache for which she has been taking some Aleve.  Denies any chest pain shortness of breath nausea vomiting or diarrhea.   Hospital Course by Problem:   Traumatic small L SDH following fall due to orthostatic syncope  MRI  No acute infarct. L thin layer SDH. Small  vessel disease. Atrophy. Old R basal ganglia, thalamic and cerebellar lacunes.  2D Echo EF 70-75%. No source of embolus   EEG Intermittent slow, left frontotemporal - no Sz  LDL 139  HgbA1c 5.3  No antithrombotic prior to admission, now on No antithrombotic given SDH.   No AEDs needed at this time, but recommend close monitoring and seizure precautions  Orthostatic hypotension   Episodes of "feelings body leaving out from her" for the last 2-3 months  Transient and pt usually can hold onto things to avoid fall  Had two falls last week with the one yesterday with LOC  Orthostatic vital positive for orthostatic hypotension - sitting BP 158/86, standing 128/74 and pt felt lightheaded with feeling of floating out of her body, needed to sit down, and recheck sitting BP 157/79.   Recheck orthos this AM were negative  Most likely the cause of her recent falls  Compression stockings  Unable to arrange home health due to insurance  Hyperlipidemia  Home meds:  lipitor 40 and fish oil  LDL 139, goal < 100  AKI Improved with IVF -resolved     Medical Consultants:   neurology   Discharge Exam:   Vitals:   03/18/19 0902 03/18/19 1145  BP: 134/73 (!) 146/59  Pulse: 71 65  Resp:  15  Temp:  98.7 F (37.1 C)  SpO2:  98%   Vitals:   03/17/19 2101 03/18/19 0338 03/18/19 0902 03/18/19 1145  BP: (!) 152/58 (!) 143/73 134/73 (!) 146/59  Pulse: 66 81  71 65  Resp:  17  15  Temp:  98.2 F (36.8 C)  98.7 F (37.1 C)  TempSrc:  Oral  Oral  SpO2:  96%  98%  Weight:      Height:        General exam: Appears calm and comfortable.   The results of significant diagnostics from this hospitalization (including imaging, microbiology, ancillary and laboratory) are listed below for reference.     Procedures and Diagnostic Studies:   EEG  Result Date: 03/17/2019 Charlsie Quest, MD     03/17/2019 12:51 PM Patient Name: Paula Hanson MRN: [id number] Epilepsy Attending:  Charlsie Quest Referring Physician/Provider: Dr Caryl Pina Date: 03/17/2019 Duration: 24.16 mins Patient history: 86 year old female presenting with acute onset of dysphasia, thin left frontal subdural hematoma was seen on CT. EEG to evaluate for seizure Level of alertness: awake AEDs during EEG study: None Technical aspects: This EEG study was done with scalp electrodes positioned according to the 10-20 International system of electrode placement. Electrical activity was acquired at a sampling rate of 500Hz  and reviewed with a high frequency filter of 70Hz  and a low frequency filter of 1Hz . EEG data were recorded continuously and digitally stored. DESCRIPTION:The posterior dominant rhythm consists of 7.5 Hz activity of moderate voltage (25-35 uV) seen predominantly in posterior head regions, symmetric and reactive to eye opening and eye closing.    Intermittent 4-5Hz  theta slowing was seen in left frontotemporal region. Physiologic photic driving was not seen during photic stimulation. Hyperventilation was not performed. ABNORMALITY - Intermittent slow, left frontotemporal IMPRESSION: This study is suggestive of cortical dysfunction in left frontotemporal region likely secondary to underlying subdural hematoma. No seizures or epileptiform discharges were seen throughout the recording. Charlsie Quest   CT Code Stroke CTA Head W/WO contrast  Result Date: 03/16/2019 CLINICAL DATA:  Initial evaluation for acute stroke, aphasia. EXAM: CT ANGIOGRAPHY HEAD AND NECK TECHNIQUE: Multidetector CT imaging of the head and neck was performed using the standard protocol during bolus administration of intravenous contrast. Multiplanar CT image reconstructions and MIPs were obtained to evaluate the vascular anatomy. Carotid stenosis measurements (when applicable) are obtained utilizing NASCET criteria, using the distal internal carotid diameter as the denominator. CONTRAST:  OMNIPAQUE IOHEXOL 350 MG/ML SOLN  COMPARISON:  Prior CT from earlier same day. FINDINGS: CTA NECK FINDINGS Aortic arch: Visualized arch ectatic measuring up to 3.8 cm in diameter. Mild to moderate plaque within the arch itself. No hemodynamically significant stenosis seen about the origin of the great vessels. Visualized subclavian arteries patent. Right carotid system: Right CCA patent from its origin to the bifurcation without stenosis. Scattered calcified plaque about the right bifurcation without hemodynamically significant stenosis. Right ICA tortuous proximally but widely patent to the skull base without stenosis, dissection or occlusion. Left carotid system: Left CCA widely patent from its origin to the bifurcation without stenosis. Scattered mixed plaque about the left bifurcation/proximal left ICA without hemodynamically significant stenosis. Left ICA mildly tortuous but widely patent distally to the skull base without stenosis, dissection, or occlusion. Vertebral arteries: Both vertebral arteries arise from the subclavian arteries. Atheromatous plaque at the origin of the left vertebral artery with secondary mild approximate 30% stenosis. Vertebral arteries otherwise widely patent within the neck without stenosis, dissection or occlusion. Skeleton: No acute osseous abnormality. No discrete lytic or blastic osseous lesions. Other neck: No other acute soft tissue abnormality within the neck. No adenopathy. Subcentimeter hypodense nodule noted within the left lobe  of thyroid, for which no follow-up imaging is recommended. Upper chest: Visualized upper chest demonstrates no acute finding. Review of the MIP images confirms the above findings CTA HEAD FINDINGS Anterior circulation: Petrous segments patent bilaterally. Scattered atheromatous plaque within the cavernous/supraclinoid ICAs with associated mild to moderate diffuse narrowing. A1 segments patent bilaterally. Right A1 hypoplastic. Normal anterior communicating artery. Both ACAs  demonstrate moderate to advanced diffuse atherosclerotic irregularity but are patent to their distal aspects without high-grade flow-limiting stenosis. Right ACA diffusely hypoplastic. No M1 stenosis or occlusion. Normal MCA bifurcations. Distal MCA branches well perfused and symmetric, although demonstrate extensive small vessel atheromatous irregularity. Posterior circulation: Vertebral arteries patent to the vertebrobasilar junction without high-grade stenosis. Posterior inferior cerebral arteries patent bilaterally. Short-segment moderate approximate 50% stenosis noted at the proximal basilar artery (series 6, image 126). Basilar otherwise patent to its distal aspect without stenosis. Superior cerebral arteries patent bilaterally. Right PCA supplied via the basilar. Fetal type origin of the left PCA. Extensive atheromatous change throughout both posterior cerebral arteries, which remain perfused to their distal aspects without high-grade stenosis. Venous sinuses: Grossly patent allowing for timing the contrast bolus. Anatomic variants: Fetal type origin of the left PCA. Previously identified small subdural hemorrhage overlying the left cerebral convexity again seen, not significantly changed measuring up to 3 mm in maximal diameter. Review of the MIP images confirms the above findings IMPRESSION: 1. Negative CTA for emergent large vessel occlusion. 2. Extensive atheromatous irregularity throughout the intracranial circulation without proximal high-grade or correctable stenosis. 3. Mild-to-moderate atherosclerotic change about the carotid bifurcations without hemodynamically significant stenosis. 4. Diffuse tortuosity of the major arterial vasculature of the head and neck, suggesting chronic underlying hypertension. Critical Value/emergent results were called by telephone at the time of interpretation on 03/16/2019 at 10:09 pm to [facility] , who verbally acknowledged these results. Electronically  Signed   By: Rise Mu M.D.   On: 03/16/2019 22:51   CT Code Stroke CTA Neck W/WO contrast  Result Date: 03/16/2019 CLINICAL DATA:  Initial evaluation for acute stroke, aphasia. EXAM: CT ANGIOGRAPHY HEAD AND NECK TECHNIQUE: Multidetector CT imaging of the head and neck was performed using the standard protocol during bolus administration of intravenous contrast. Multiplanar CT image reconstructions and MIPs were obtained to evaluate the vascular anatomy. Carotid stenosis measurements (when applicable) are obtained utilizing NASCET criteria, using the distal internal carotid diameter as the denominator. CONTRAST:  OMNIPAQUE IOHEXOL 350 MG/ML SOLN COMPARISON:  Prior CT from earlier same day. FINDINGS: CTA NECK FINDINGS Aortic arch: Visualized arch ectatic measuring up to 3.8 cm in diameter. Mild to moderate plaque within the arch itself. No hemodynamically significant stenosis seen about the origin of the great vessels. Visualized subclavian arteries patent. Right carotid system: Right CCA patent from its origin to the bifurcation without stenosis. Scattered calcified plaque about the right bifurcation without hemodynamically significant stenosis. Right ICA tortuous proximally but widely patent to the skull base without stenosis, dissection or occlusion. Left carotid system: Left CCA widely patent from its origin to the bifurcation without stenosis. Scattered mixed plaque about the left bifurcation/proximal left ICA without hemodynamically significant stenosis. Left ICA mildly tortuous but widely patent distally to the skull base without stenosis, dissection, or occlusion. Vertebral arteries: Both vertebral arteries arise from the subclavian arteries. Atheromatous plaque at the origin of the left vertebral artery with secondary mild approximate 30% stenosis. Vertebral arteries otherwise widely patent within the neck without stenosis, dissection or occlusion. Skeleton: No acute osseous  abnormality. No discrete  lytic or blastic osseous lesions. Other neck: No other acute soft tissue abnormality within the neck. No adenopathy. Subcentimeter hypodense nodule noted within the left lobe of thyroid, for which no follow-up imaging is recommended. Upper chest: Visualized upper chest demonstrates no acute finding. Review of the MIP images confirms the above findings CTA HEAD FINDINGS Anterior circulation: Petrous segments patent bilaterally. Scattered atheromatous plaque within the cavernous/supraclinoid ICAs with associated mild to moderate diffuse narrowing. A1 segments patent bilaterally. Right A1 hypoplastic. Normal anterior communicating artery. Both ACAs demonstrate moderate to advanced diffuse atherosclerotic irregularity but are patent to their distal aspects without high-grade flow-limiting stenosis. Right ACA diffusely hypoplastic. No M1 stenosis or occlusion. Normal MCA bifurcations. Distal MCA branches well perfused and symmetric, although demonstrate extensive small vessel atheromatous irregularity. Posterior circulation: Vertebral arteries patent to the vertebrobasilar junction without high-grade stenosis. Posterior inferior cerebral arteries patent bilaterally. Short-segment moderate approximate 50% stenosis noted at the proximal basilar artery (series 6, image 126). Basilar otherwise patent to its distal aspect without stenosis. Superior cerebral arteries patent bilaterally. Right PCA supplied via the basilar. Fetal type origin of the left PCA. Extensive atheromatous change throughout both posterior cerebral arteries, which remain perfused to their distal aspects without high-grade stenosis. Venous sinuses: Grossly patent allowing for timing the contrast bolus. Anatomic variants: Fetal type origin of the left PCA. Previously identified small subdural hemorrhage overlying the left cerebral convexity again seen, not significantly changed measuring up to 3 mm in maximal diameter. Review of the  MIP images confirms the above findings IMPRESSION: 1. Negative CTA for emergent large vessel occlusion. 2. Extensive atheromatous irregularity throughout the intracranial circulation without proximal high-grade or correctable stenosis. 3. Mild-to-moderate atherosclerotic change about the carotid bifurcations without hemodynamically significant stenosis. 4. Diffuse tortuosity of the major arterial vasculature of the head and neck, suggesting chronic underlying hypertension. Critical Value/emergent results were called by telephone at the time of interpretation on 03/16/2019 at 10:09 pm to [facility] , who verbally acknowledged these results. Electronically Signed   By: Rise MuBenjamin  McClintock M.D.   On: 03/16/2019 22:51   MR BRAIN WO CONTRAST  Result Date: 03/17/2019 CLINICAL DATA:  Initial evaluation for acute ataxia, stroke suspected. EXAM: MRI HEAD WITHOUT CONTRAST TECHNIQUE: Multiplanar, multiecho pulse sequences of the brain and surrounding structures were obtained without intravenous contrast. COMPARISON:  Prior CT and CTA from 03/16/2019. FINDINGS: Brain: Generalized age-related cerebral atrophy. Patchy T2/FLAIR hyperintensity within the periventricular deep white matter both cerebral hemispheres most consistent with chronic small vessel ischemic disease, mild in nature. Few scatter remote lacunar infarcts noted involving the right basal ganglia and bilateral thalami. Few tiny remote bilateral cerebellar infarcts noted. No abnormal foci of restricted diffusion to suggest acute or subacute ischemia. Gray-white matter differentiation maintained. No encephalomalacia to suggest chronic cortical infarction. No mass lesion, midline shift or mass effect. No hydrocephalus. Small acute subdural hematoma again noted overlying the left cerebral convexity, measuring up to 3-4 mm in maximal thickness. No significant mass effect. This is relatively stable from previous. No other acute intracranial hemorrhage.  Pituitary gland suprasellar region normal. Midline structures intact. Vascular: Major intracranial vascular flow voids are maintained. Skull and upper cervical spine: Craniocervical junction within normal limits. Upper cervical spine normal. Bone marrow signal intensity within normal limits. Hyperostosis frontalis interna noted. No scalp soft tissue abnormality. Sinuses/Orbits: Patient status post bilateral ocular lens replacement. Paranasal sinuses are clear. Moderate left mastoid effusion noted. Visualized nasopharynx within normal limits. Other: None. IMPRESSION: 1. No acute intracranial infarct. 2. 3-4  mm subdural hematoma overlying the left cerebral convexity without significant mass effect, stable. 3. Age-related cerebral atrophy with mild chronic small vessel ischemic disease, with a few scattered remote lacunar infarcts involving the right basal ganglia, thalami, and cerebellum. Electronically Signed   By: Rise MuBenjamin  McClintock M.D.   On: 03/17/2019 02:21   ECHOCARDIOGRAM COMPLETE  Result Date: 03/17/2019   ECHOCARDIOGRAM REPORT   Patient Name:   Paula Hanson Date of Exam: 03/17/2019 Medical Rec #:  [id number]      Height:       64.0 in Accession #:    [id number]     Weight:       168.0 lb Date of Birth:  16-Nov-1930      BSA:          1.82 m Patient Age:    88 years       BP:           154/72 mmHg Patient Gender: F              HR:           86 bpm. Exam Location:  Inpatient Procedure: 2D Echo Indications:    stroke 434.91  History:        Patient has no prior history of Echocardiogram examinations.                 Risk Factors:Hypertension and Dyslipidemia.  Sonographer:    Delcie RochLauren Pennington Referring Phys: [id number] JINDONG XU IMPRESSIONS  1. Left ventricular ejection fraction, by visual estimation, is 70 to 75%. The left ventricle has normal function. There is borderline left ventricular hypertrophy.  2. The left ventricle has no regional wall motion abnormalities.  3. Global right ventricle has normal  systolic function.The right ventricular size is normal. No increase in right ventricular wall thickness.  4. Left atrial size was normal.  5. Right atrial size was normal.  6. Presence of pericardial fat pad.  7. Mild mitral annular calcification.  8. The mitral valve is grossly normal. Trivial mitral valve regurgitation.  9. The tricuspid valve is grossly normal. 10. The aortic valve is tricuspid. Aortic valve regurgitation is mild. Mild aortic valve stenosis. 11. Aortic valve area, by VTI measures 1.55 cm. 12. Aortic valve mean gradient measures 11.0 mmHg. 13. Pulmonic regurgitation is mild. 14. The pulmonic valve was grossly normal. Pulmonic valve regurgitation is mild. 15. Mildly elevated pulmonary artery systolic pressure. 16. The tricuspid regurgitant velocity is 2.99 m/s, and with an assumed right atrial pressure of 3 mmHg, the estimated right ventricular systolic pressure is mildly elevated at 38.8 mmHg. 17. The inferior vena cava is normal in size with greater than 50% respiratory variability, suggesting right atrial pressure of 3 mmHg. FINDINGS  Left Ventricle: Left ventricular ejection fraction, by visual estimation, is 70 to 75%. The left ventricle has normal function. The left ventricle has no regional wall motion abnormalities. The left ventricular internal cavity size was the left ventricle is normal in size. There is borderline left ventricular hypertrophy. Right Ventricle: The right ventricular size is normal. No increase in right ventricular wall thickness. Global RV systolic function is has normal systolic function. The tricuspid regurgitant velocity is 2.99 m/s, and with an assumed right atrial pressure  of 3 mmHg, the estimated right ventricular systolic pressure is mildly elevated at 38.8 mmHg. Left Atrium: Left atrial size was normal in size. Right Atrium: Right atrial size was normal in size Pericardium: There is no evidence of pericardial effusion. Presence  of pericardial fat pad. Mitral  Valve: The mitral valve is grossly normal. Mild mitral annular calcification. Trivial mitral valve regurgitation. Tricuspid Valve: The tricuspid valve is grossly normal. Tricuspid valve regurgitation is trivial. Aortic Valve: The aortic valve is tricuspid. Aortic valve regurgitation is mild. Aortic regurgitation PHT measures 307 msec. Mild aortic stenosis is present. Mild to moderate aortic valve annular calcification. Aortic valve mean gradient measures 11.0 mmHg. Aortic valve peak gradient measures 18.8 mmHg. Aortic valve area, by VTI measures 1.55 cm. Pulmonic Valve: The pulmonic valve was grossly normal. Pulmonic valve regurgitation is mild. Pulmonic regurgitation is mild. Aorta: The aortic root is normal in size and structure. Venous: The inferior vena cava is normal in size with greater than 50% respiratory variability, suggesting right atrial pressure of 3 mmHg. IAS/Shunts: No atrial level shunt detected by color flow Doppler.  LEFT VENTRICLE PLAX 2D LVIDd:         4.10 cm  Diastology LVIDs:         2.70 cm  LV e' lateral:   9.14 cm/s LV PW:         0.90 cm  LV E/e' lateral: 7.5 LV IVS:        1.00 cm  LV e' medial:    6.31 cm/s LVOT diam:     1.70 cm  LV E/e' medial:  10.8 LV SV:         47 ml LV SV Index:   25.14 LVOT Area:     2.27 cm  RIGHT VENTRICLE RV S prime:     15.70 cm/s LEFT ATRIUM             Index       RIGHT ATRIUM          Index LA diam:        3.00 cm 1.65 cm/m  RA Area:     9.11 cm LA Vol (A2C):   29.2 ml 16.07 ml/m RA Volume:   15.50 ml 8.53 ml/m LA Vol (A4C):   33.8 ml 18.61 ml/m LA Biplane Vol: 32.0 ml 17.62 ml/m  AORTIC VALVE AV Area (Vmax):    1.58 cm AV Area (Vmean):   1.41 cm AV Area (VTI):     1.55 cm AV Vmax:           217.00 cm/s AV Vmean:          156.000 cm/s AV VTI:            0.394 m AV Peak Grad:      18.8 mmHg AV Mean Grad:      11.0 mmHg LVOT Vmax:         151.00 cm/s LVOT Vmean:        96.700 cm/s LVOT VTI:          0.269 m LVOT/AV VTI ratio: 0.68 AI PHT:             307 msec  AORTA Ao Root diam: 3.10 cm MITRAL VALVE                         TRICUSPID VALVE MV Area (PHT): 2.99 cm              TR Peak grad:   35.8 mmHg MV PHT:        73.66 msec            TR Vmax:        299.00 cm/s MV Decel Time:  254 msec MV E velocity: 68.10 cm/s  103 cm/s  SHUNTS MV A velocity: 103.00 cm/s 70.3 cm/s Systemic VTI:  0.27 m MV E/A ratio:  0.66        1.5       Systemic Diam: 1.70 cm  Nona Dell MD Electronically signed by Nona Dell MD Signature Date/Time: 03/17/2019/11:26:09 AM    Final    CT HEAD CODE STROKE WO CONTRAST  Result Date: 03/16/2019 CLINICAL DATA:  Code stroke. Acute presentation with aphasia. Last known normal 1710 hours EXAM: CT HEAD WITHOUT CONTRAST TECHNIQUE: Contiguous axial images were obtained from the base of the skull through the vertex without intravenous contrast. COMPARISON:  None. FINDINGS: Brain: No sign of acute infarction. Mild generalized age related volume loss. No focal abnormality seen affecting the brainstem. 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 Old lacunar infarctions in the right basal ganglia. Minimal small vessel change of the white matter elsewhere. No mass lesion. The patient has a thin subdural hematoma on the left, maximal thickness 3 mm. No mass-effect upon the brain. Vascular: There is atherosclerotic calcification of the major vessels at the base of the brain. Skull: Negative Sinuses/Orbits: Clear/normal Other: None ASPECTS (Alberta Stroke Program Early CT Score) - Ganglionic level infarction (caudate, lentiform nuclei, internal capsule, insula, M1-M3 cortex): 7 - Supraganglionic infarction (M4-M6 cortex): 3 Total score (0-10 with 10 being normal): 10 IMPRESSION: 1. No sign of acute infarction. 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 2. Thin subdural hematoma on the left, maximal thickness 3 mm. No apparent mass effect. 3. ASPECTS is 10 4. These results were  called by telephone at the time of interpretation on 03/16/2019 at 7:02 pm to provider [facility] , who verbally acknowledged these results. Electronically Signed   By: Paulina Fusi M.D.   On: 03/16/2019 19:04     Labs:   Basic Metabolic Panel: Recent Labs  Lab 03/16/19 1851 03/16/19 1852 03/18/19 0246  NA 133* 134* 136  K 4.3 4.3 3.8  CL 99 99 105  CO2 20*  --  21*  GLUCOSE 127* 124* 96  BUN 19 19 20   CREATININE 1.23* 1.20* 1.03*  CALCIUM 9.1  --  9.0   GFR Estimated Creatinine Clearance: 39.2 mL/min (A) (by C-G formula based on SCr of 1.03 mg/dL (H)). Liver Function Tests: Recent Labs  Lab 03/16/19 1851  AST 15  ALT 14  ALKPHOS 55  BILITOT 1.3*  PROT 7.7  ALBUMIN 3.6   No results for input(s): LIPASE, AMYLASE in the last 168 hours. No results for input(s): AMMONIA in the last 168 hours. Coagulation profile Recent Labs  Lab 03/16/19 1851  INR 1.0    CBC: Recent Labs  Lab 03/16/19 1851 03/16/19 1852  WBC 11.6*  --   NEUTROABS 9.4*  --   HGB 12.2 12.9  HCT 37.1 38.0  MCV 89.0  --   PLT 276  --    Cardiac Enzymes: No results for input(s): CKTOTAL, CKMB, CKMBINDEX, TROPONINI in the last 168 hours. BNP: Invalid input(s): POCBNP CBG: Recent Labs  Lab 03/16/19 1842  GLUCAP 126*   D-Dimer No results for input(s): DDIMER in the last 72 hours. Hgb A1c Recent Labs    03/17/19 0834  HGBA1C 5.3   Lipid Profile Recent Labs    03/17/19 0834  CHOL 205*  HDL 45  LDLCALC 139*  TRIG 107  CHOLHDL 4.6   Thyroid function studies No results  for input(s): TSH, T4TOTAL, T3FREE, THYROIDAB in the last 72 hours.  Invalid input(s): FREET3 Anemia work up No results for input(s): VITAMINB12, FOLATE, FERRITIN, TIBC, IRON, RETICCTPCT in the last 72 hours. Microbiology Recent Results (from the past 240 hour(s))  Respiratory Panel by RT PCR (Flu A&B, Covid) - Nasopharyngeal Swab     Status: None   Collection Time: 03/16/19 11:40 PM   Specimen:  Nasopharyngeal Swab  Result Value Ref Range Status   SARS Coronavirus 2 by RT PCR NEGATIVE NEGATIVE Final    Comment: (NOTE) SARS-CoV-2 target nucleic acids are NOT DETECTED. The SARS-CoV-2 RNA is generally detectable in upper respiratoy specimens during the acute phase of infection. The lowest concentration of SARS-CoV-2 viral copies this assay can detect is 131 copies/mL. A negative result does not preclude SARS-Cov-2 infection and should not be used as the sole basis for treatment or other patient management decisions. A negative result may occur with  improper specimen collection/handling, submission of specimen other than nasopharyngeal swab, presence of viral mutation(s) within the areas targeted by this assay, and inadequate number of viral copies (<131 copies/mL). A negative result must be combined with clinical observations, patient history, and epidemiological information. The expected result is Negative. Fact Sheet for Patients:  https://www.moore.com/ Fact Sheet for Healthcare Providers:  https://www.young.biz/ This test is not yet ap proved or cleared by the [place] FDA and  has been authorized for detection and/or diagnosis of SARS-CoV-2 by FDA under an Emergency Use Authorization (EUA). This EUA will remain  in effect (meaning this test can be used) for the duration of the COVID-19 declaration under Section 564(b)(1) of the Act, 21 U.S.C. section 360bbb-3(b)(1), unless the authorization is terminated or revoked sooner.    Influenza A by PCR NEGATIVE NEGATIVE Final   Influenza B by PCR NEGATIVE NEGATIVE Final    Comment: (NOTE) The Xpert Xpress SARS-CoV-2/FLU/RSV assay is intended as an aid in  the diagnosis of influenza from Nasopharyngeal swab specimens and  should not be used as a sole basis for treatment. Nasal washings and  aspirates are unacceptable for Xpert Xpress SARS-CoV-2/FLU/RSV  testing. Fact Sheet for  Patients: https://www.moore.com/ Fact Sheet for Healthcare Providers: https://www.young.biz/ This test is not yet approved or cleared by the [place] FDA and  has been authorized for detection and/or diagnosis of SARS-CoV-2 by  FDA under an Emergency Use Authorization (EUA). This EUA will remain  in effect (meaning this test can be used) for the duration of the  Covid-19 declaration under Section 564(b)(1) of the Act, 21  U.S.C. section 360bbb-3(b)(1), unless the authorization is  terminated or revoked. Performed at [facility], [address], [place], [place] [address]      Discharge Instructions:   Discharge Instructions    Diet - low sodium heart healthy   Complete by: As directed    Discharge instructions   Complete by: As directed    You were dehydrated resulting in orthostatic hypotension that caused you to fall and hit your head developing a small sub dural hematoma Home health with 24 hour supervision for the time being Encourage food/water Can wear compression hose during the day for help with orthostatic hypotension   Increase activity slowly   Complete by: As directed      Allergies as of 03/18/2019   No Known Allergies     Medication List    STOP taking these medications   meloxicam 7.5 MG tablet Commonly known as: MOBIC   naproxen 500 MG  tablet Commonly known as: NAPROSYN     TAKE these medications   acetaminophen 325 MG tablet Commonly known as: TYLENOL Take 2 tablets (650 mg total) by mouth every 4 (four) hours as needed for mild pain (or temp > 37.5 C (99.5 F)).   ALPRAZolam 0.5 MG tablet Commonly known as: XANAX Take 0.5 mg by mouth at bedtime as needed for anxiety.   atorvastatin 40 MG tablet Commonly known as: LIPITOR Take 40 mg by mouth daily.   calcium carbonate 1500 (600 Ca) MG Tabs tablet Commonly known as: OSCAL Take by mouth 2 (two) times daily with a meal.   cholecalciferol 1000  units tablet Commonly known as: VITAMIN D Take 1,000 Units by mouth 2 (two) times daily.   FISH OIL PO Take 3 capsules by mouth daily.   labetalol 200 MG tablet Commonly known as: NORMODYNE Take 200 mg by mouth 2 (two) times daily.   omeprazole 40 MG capsule Commonly known as: PRILOSEC Take 1 capsule by mouth daily.   vitamin B-12 1000 MCG tablet Commonly known as: CYANOCOBALAMIN Take 1,000 mcg by mouth daily.            Durable Medical Equipment  (From admission, onward)         Start     Ordered   03/18/19 1104  For home use only DME 4 wheeled rolling walker with seat  Once    Question:  Patient needs a walker to treat with the following condition  Answer:  Weakness   03/18/19 1104         Follow-up Information    Hasanaj, Myra Gianotti, MD Follow up in 1 week(s).   Specialty: Internal Medicine Why: Please ask for Dr. Bartholomew Crews office to arrange home health physical therapy for you.  It was not able to be arranged due to staffing of home health agencies this weekend.  It's possible that an agency could take you as a new patient in the next few weeks Contact information: [address] [place] [address] [phone]            Time coordinating discharge: 35 min  Signed:  Joseph Art DO  [facility] 03/18/2019, 4:21 PM

## 2019-03-18 NOTE — Progress Notes (Signed)
Discharge instructions given to patient and son. All medications and follow up appointments discussed. All questions and concerns addressed. IV out. Off monitor CCMD notified Ginette Otto, RN

## 2019-03-21 DIAGNOSIS — I6200 Nontraumatic subdural hemorrhage, unspecified: Secondary | ICD-10-CM | POA: Diagnosis not present

## 2019-03-21 DIAGNOSIS — E860 Dehydration: Secondary | ICD-10-CM | POA: Diagnosis not present

## 2019-03-21 DIAGNOSIS — R569 Unspecified convulsions: Secondary | ICD-10-CM | POA: Diagnosis not present

## 2019-03-21 DIAGNOSIS — Z0389 Encounter for observation for other suspected diseases and conditions ruled out: Secondary | ICD-10-CM | POA: Diagnosis not present

## 2019-03-21 DIAGNOSIS — Z66 Do not resuscitate: Secondary | ICD-10-CM | POA: Diagnosis not present

## 2019-03-21 DIAGNOSIS — I639 Cerebral infarction, unspecified: Secondary | ICD-10-CM | POA: Diagnosis not present

## 2019-03-21 DIAGNOSIS — E785 Hyperlipidemia, unspecified: Secondary | ICD-10-CM | POA: Diagnosis not present

## 2019-03-21 DIAGNOSIS — I469 Cardiac arrest, cause unspecified: Secondary | ICD-10-CM | POA: Diagnosis not present

## 2019-03-21 DIAGNOSIS — R4701 Aphasia: Secondary | ICD-10-CM | POA: Diagnosis not present

## 2019-03-21 DIAGNOSIS — J9811 Atelectasis: Secondary | ICD-10-CM | POA: Diagnosis not present

## 2019-03-21 DIAGNOSIS — N390 Urinary tract infection, site not specified: Secondary | ICD-10-CM | POA: Diagnosis not present

## 2019-03-21 DIAGNOSIS — I161 Hypertensive emergency: Secondary | ICD-10-CM | POA: Diagnosis not present

## 2019-03-21 DIAGNOSIS — Z20822 Contact with and (suspected) exposure to covid-19: Secondary | ICD-10-CM | POA: Diagnosis not present

## 2019-03-21 DIAGNOSIS — I10 Essential (primary) hypertension: Secondary | ICD-10-CM | POA: Diagnosis not present

## 2019-03-21 DIAGNOSIS — R1310 Dysphagia, unspecified: Secondary | ICD-10-CM | POA: Diagnosis not present

## 2019-03-21 DIAGNOSIS — B961 Klebsiella pneumoniae [K. pneumoniae] as the cause of diseases classified elsewhere: Secondary | ICD-10-CM | POA: Diagnosis not present

## 2019-03-21 DIAGNOSIS — R4781 Slurred speech: Secondary | ICD-10-CM | POA: Diagnosis not present

## 2019-03-21 DIAGNOSIS — Z515 Encounter for palliative care: Secondary | ICD-10-CM | POA: Diagnosis not present

## 2019-03-22 DIAGNOSIS — I639 Cerebral infarction, unspecified: Secondary | ICD-10-CM | POA: Diagnosis not present

## 2019-03-22 DIAGNOSIS — I161 Hypertensive emergency: Secondary | ICD-10-CM | POA: Diagnosis not present

## 2019-03-22 DIAGNOSIS — Z515 Encounter for palliative care: Secondary | ICD-10-CM | POA: Diagnosis not present

## 2019-03-22 DIAGNOSIS — Z66 Do not resuscitate: Secondary | ICD-10-CM | POA: Diagnosis not present

## 2019-03-22 DIAGNOSIS — N390 Urinary tract infection, site not specified: Secondary | ICD-10-CM | POA: Diagnosis not present

## 2019-03-26 DIAGNOSIS — R4701 Aphasia: Secondary | ICD-10-CM | POA: Diagnosis not present

## 2019-03-27 DIAGNOSIS — I6200 Nontraumatic subdural hemorrhage, unspecified: Secondary | ICD-10-CM | POA: Diagnosis not present

## 2019-03-30 DIAGNOSIS — J9811 Atelectasis: Secondary | ICD-10-CM | POA: Diagnosis not present

## 2019-03-31 DIAGNOSIS — I161 Hypertensive emergency: Secondary | ICD-10-CM | POA: Diagnosis not present

## 2019-03-31 DIAGNOSIS — Z515 Encounter for palliative care: Secondary | ICD-10-CM | POA: Diagnosis not present

## 2019-03-31 DIAGNOSIS — Z66 Do not resuscitate: Secondary | ICD-10-CM | POA: Diagnosis not present

## 2019-03-31 DIAGNOSIS — N390 Urinary tract infection, site not specified: Secondary | ICD-10-CM | POA: Diagnosis not present

## 2019-03-31 DIAGNOSIS — I639 Cerebral infarction, unspecified: Secondary | ICD-10-CM | POA: Diagnosis not present

## 2019-04-17 DEATH — deceased

## 2021-03-27 IMAGING — MR MR HEAD W/O CM
12 of 13 series · 44 of 48 positions shown · non-contrast
Comparison: Prior CT and CTA from 03/16/2019.

CLINICAL DATA: Initial evaluation for acute ataxia, stroke
suspected.

EXAM:
MRI HEAD WITHOUT CONTRAST
TECHNIQUE: Multiplanar, multiecho pulse sequences of the brain and surrounding
structures were obtained without intravenous contrast.

[Series 5: DWI · axial · 3.0mm · 0.92mm/px · z∈[-75,+70]mm · 8 of 104 slices shown (1 of 4)]
[im 1/104]
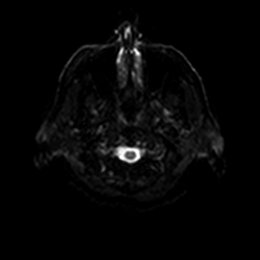
[im 15/104]
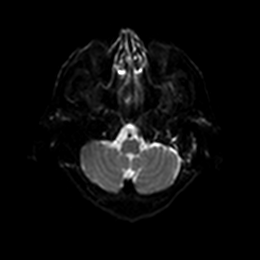
[im 30/104]
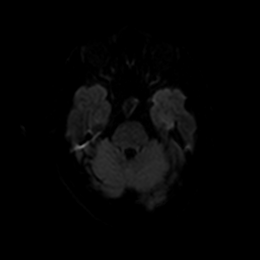
[im 45/104]
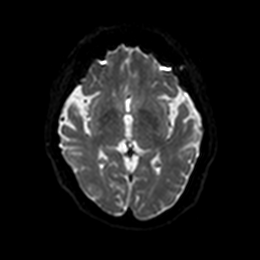
[im 59/104]
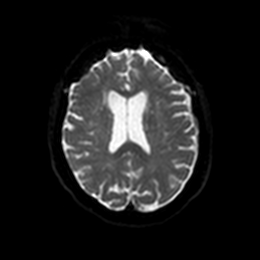
[im 74/104]
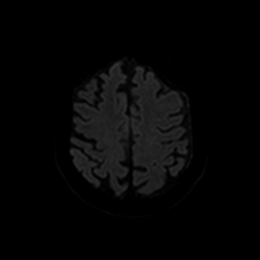
[im 89/104]
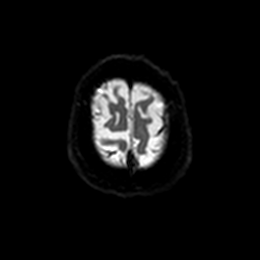
[im 104/104]
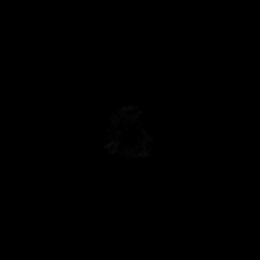

[Series 6: DWI · axial · 3.0mm · 0.92mm/px · z∈[-75,+70]mm · 4 of 52 slices shown (2 of 4)]
[im 1/52]
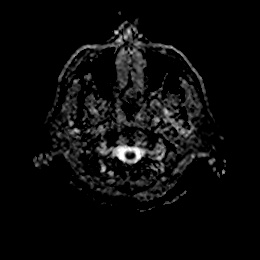
[im 18/52]
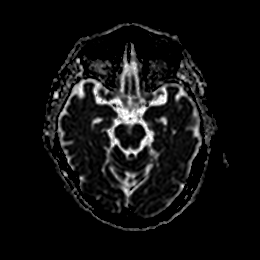
[im 35/52]
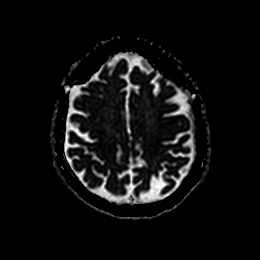
[im 52/52]
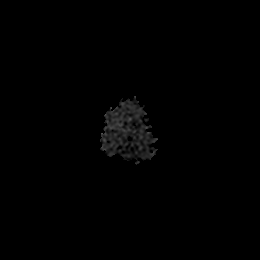

[Series 7: DWI · coronal · 4.0mm · 0.88mm/px · 6 of 74 slices shown (3 of 4)]
[im 1/74]
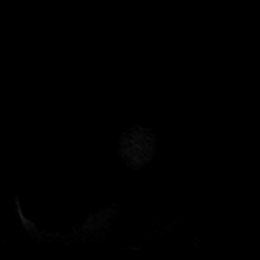
[im 15/74]
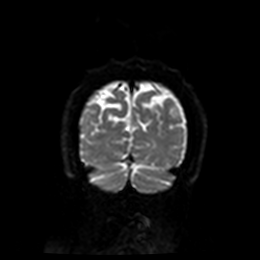
[im 30/74]
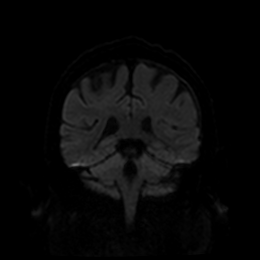
[im 44/74]
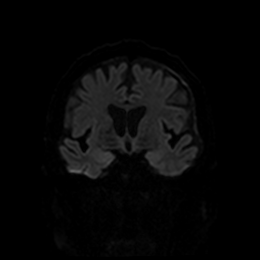
[im 59/74]
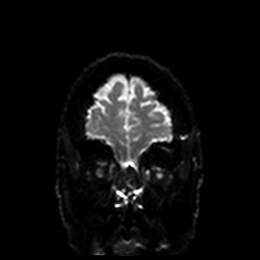
[im 74/74]
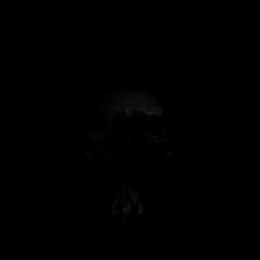

[Series 8: DWI · coronal · 4.0mm · 0.88mm/px · 3 of 37 slices shown (4 of 4)]
[im 1/37]
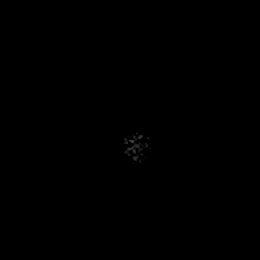
[im 19/37]
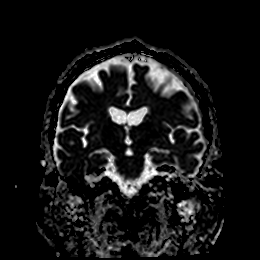
[im 37/37]
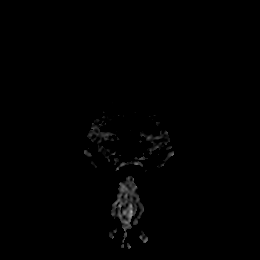

[Series 9: FLAIR · axial · 5.0mm · 0.45mm/px · z∈[-72,+70]mm · 2 of 26 slices shown]
[im 1/26]
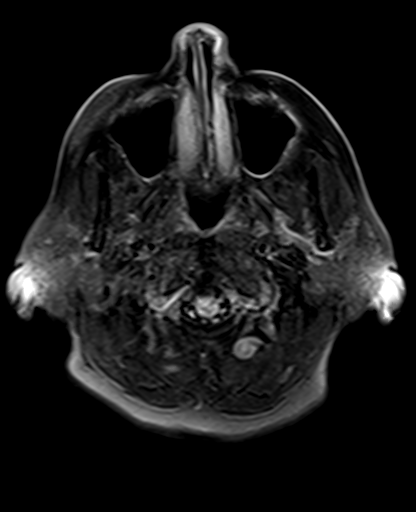
[im 26/26]
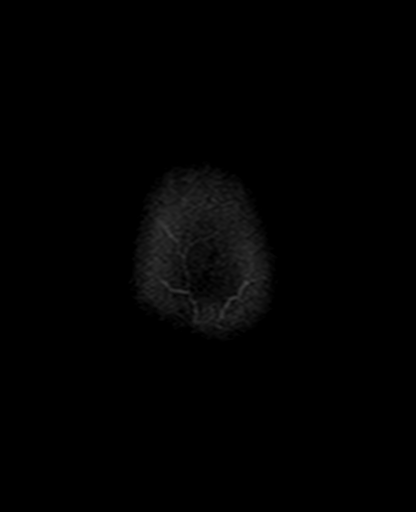

[Series 10: mag_images · axial · 3.0mm · 0.90mm/px · z∈[-74,+71]mm · 4 of 52 slices shown]
[im 1/52]
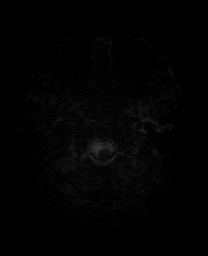
[im 18/52]
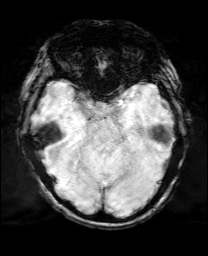
[im 35/52]
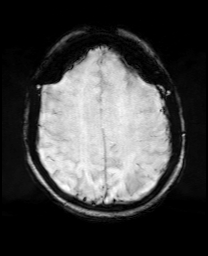
[im 52/52]
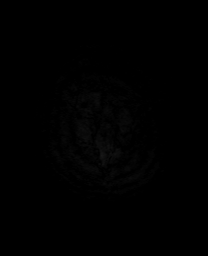

[Series 11: pha_images · axial · 3.0mm · 0.90mm/px · z∈[-74,+69]mm · 4 of 50 slices shown]
[im 1/50]
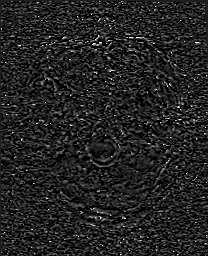
[im 17/50]
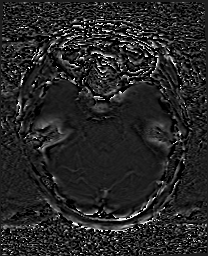
[im 33/50]
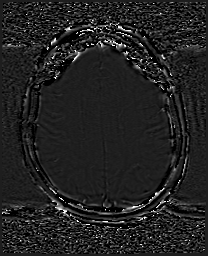
[im 50/50]
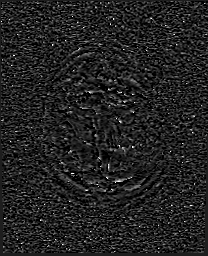

[Series 12: swi_images · axial · 3.0mm · 0.90mm/px · z∈[-74,+71]mm · 4 of 52 slices shown]
[im 1/52]
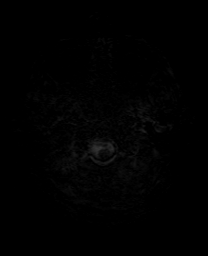
[im 18/52]
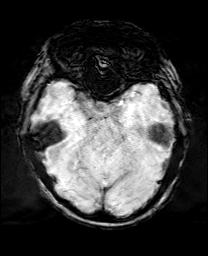
[im 35/52]
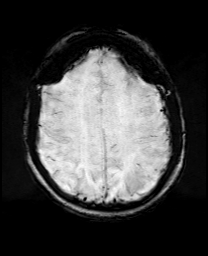
[im 52/52]
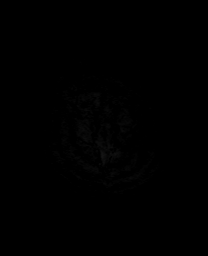

[Series 13: mip_images(sw) · axial · 24.0mm · 0.90mm/px · z∈[-64,+61]mm · 3 of 45 slices shown]
[im 1/45]
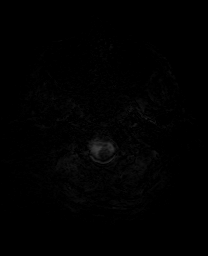
[im 23/45]
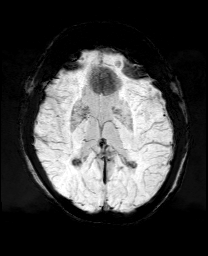
[im 45/45]
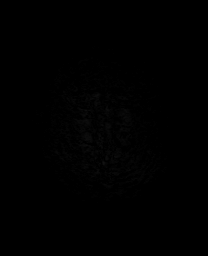

[Series 14: T1 · sagittal · 5.0mm · 0.75mm/px · 2 of 26 slices shown]
[im 1/26]
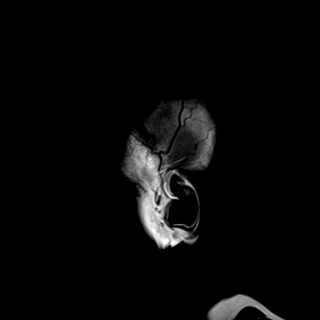
[im 26/26]
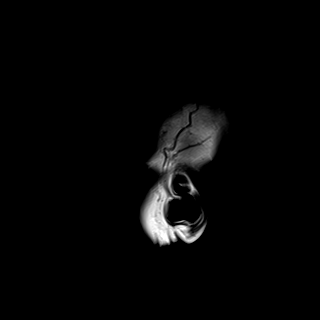

[Series 15: T2 · axial · 5.0mm · 0.72mm/px · z∈[-75,+68]mm · 2 of 26 slices shown (1 of 2)]
[im 1/26]
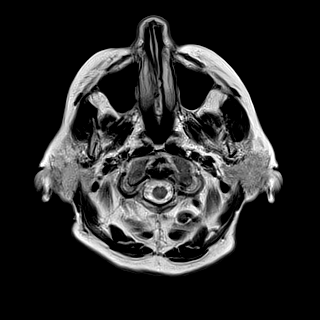
[im 26/26]
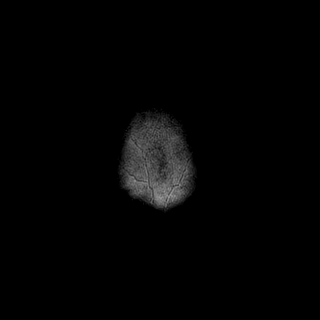

[Series 17: T2 · coronal · 5.0mm · 0.34mm/px · 2 of 31 slices shown (2 of 2)]
[im 1/31]
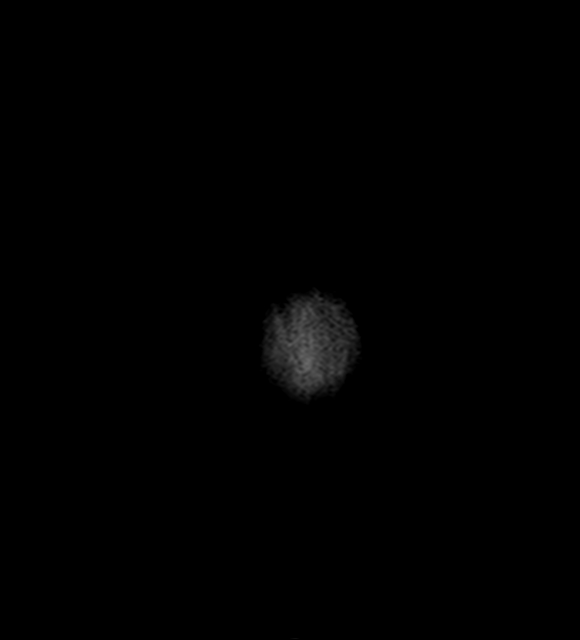
[im 31/31]
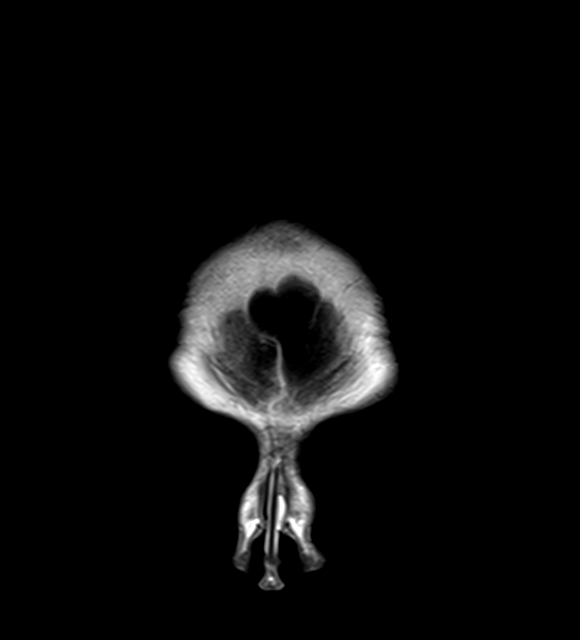

[44 of 48 positions shown; findings below may reference images not displayed]

FINDINGS: Brain: Generalized age-related cerebral atrophy. Patchy T2/FLAIR
hyperintensity within the periventricular deep white matter both
cerebral hemispheres most consistent with chronic small vessel
ischemic disease, mild in nature. Few scatter remote lacunar
infarcts noted involving the right basal ganglia and bilateral
thalami. Few tiny remote bilateral cerebellar infarcts noted.

No abnormal foci of restricted diffusion to suggest acute or
subacute ischemia. Gray-white matter differentiation maintained. No
encephalomalacia to suggest chronic cortical infarction.

No mass lesion, midline shift or mass effect. No hydrocephalus.
Small acute subdural hematoma again noted overlying the left
cerebral convexity, measuring up to 3-4 mm in maximal thickness. No
significant mass effect. This is relatively stable from previous. No
other acute intracranial hemorrhage.

Pituitary gland suprasellar region normal. Midline structures
intact.

Vascular: Major intracranial vascular flow voids are maintained.

Skull and upper cervical spine: Craniocervical junction within
normal limits. Upper cervical spine normal. Bone marrow signal
intensity within normal limits. Hyperostosis frontalis interna
noted. No scalp soft tissue abnormality.

Sinuses/Orbits: Patient status post bilateral ocular lens
replacement. Paranasal sinuses are clear. Moderate left mastoid
effusion noted. Visualized nasopharynx within normal limits.

Other: None.
IMPRESSION: 1. No acute intracranial infarct.
2. 3-4 mm subdural hematoma overlying the left cerebral convexity
without significant mass effect, stable.
3. Age-related cerebral atrophy with mild chronic small vessel
ischemic disease, with a few scattered remote lacunar infarcts
involving the right basal ganglia, thalami, and cerebellum.
